# Patient Record
Sex: Male | Born: 1970 | Race: White | Hispanic: No | Smoking: Current every day smoker
Health system: Southern US, Community
[De-identification: ages and names within clinical notes are randomized; demographics above are authoritative.]

## PROBLEM LIST (undated history)

## (undated) DIAGNOSIS — F4310 Post-traumatic stress disorder, unspecified: Secondary | ICD-10-CM

## (undated) DIAGNOSIS — F329 Major depressive disorder, single episode, unspecified: Secondary | ICD-10-CM

## (undated) DIAGNOSIS — E781 Pure hyperglyceridemia: Secondary | ICD-10-CM

## (undated) DIAGNOSIS — F419 Anxiety disorder, unspecified: Secondary | ICD-10-CM

## (undated) DIAGNOSIS — F32A Depression, unspecified: Secondary | ICD-10-CM

## (undated) DIAGNOSIS — M1990 Unspecified osteoarthritis, unspecified site: Secondary | ICD-10-CM

## (undated) DIAGNOSIS — C801 Malignant (primary) neoplasm, unspecified: Secondary | ICD-10-CM

## (undated) DIAGNOSIS — M549 Dorsalgia, unspecified: Secondary | ICD-10-CM

## (undated) DIAGNOSIS — D696 Thrombocytopenia, unspecified: Secondary | ICD-10-CM

## (undated) HISTORY — PX: WISDOM TOOTH EXTRACTION: SHX21

## (undated) HISTORY — PX: TOOTH EXTRACTION: SUR596

## (undated) HISTORY — DX: Depression, unspecified: F32.A

## (undated) HISTORY — DX: Unspecified osteoarthritis, unspecified site: M19.90

---
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
## 2002-01-30 ENCOUNTER — Emergency Department (HOSPITAL_COMMUNITY): Admission: EM | Admit: 2002-01-30 | Discharge: 2002-01-30 | Payer: Self-pay | Admitting: Emergency Medicine

## 2002-06-27 ENCOUNTER — Emergency Department (HOSPITAL_COMMUNITY): Admission: EM | Admit: 2002-06-27 | Discharge: 2002-06-27 | Payer: Self-pay | Admitting: Emergency Medicine

## 2002-11-08 ENCOUNTER — Emergency Department (HOSPITAL_COMMUNITY): Admission: EM | Admit: 2002-11-08 | Discharge: 2002-11-08 | Payer: Self-pay | Admitting: Emergency Medicine

## 2003-02-01 ENCOUNTER — Emergency Department (HOSPITAL_COMMUNITY): Admission: EM | Admit: 2003-02-01 | Discharge: 2003-02-01 | Payer: Self-pay

## 2003-07-01 ENCOUNTER — Emergency Department (HOSPITAL_COMMUNITY): Admission: AD | Admit: 2003-07-01 | Discharge: 2003-07-02 | Payer: Self-pay | Admitting: Emergency Medicine

## 2003-07-02 ENCOUNTER — Emergency Department (HOSPITAL_COMMUNITY): Admission: EM | Admit: 2003-07-02 | Discharge: 2003-07-02 | Payer: Self-pay

## 2004-01-07 ENCOUNTER — Emergency Department (HOSPITAL_COMMUNITY): Admission: EM | Admit: 2004-01-07 | Discharge: 2004-01-07 | Payer: Self-pay | Admitting: Emergency Medicine

## 2004-03-11 ENCOUNTER — Emergency Department (HOSPITAL_COMMUNITY): Admission: EM | Admit: 2004-03-11 | Discharge: 2004-03-12 | Payer: Self-pay | Admitting: Emergency Medicine

## 2004-03-29 ENCOUNTER — Emergency Department (HOSPITAL_COMMUNITY): Admission: EM | Admit: 2004-03-29 | Discharge: 2004-03-29 | Payer: Self-pay | Admitting: Emergency Medicine

## 2004-04-18 ENCOUNTER — Emergency Department (HOSPITAL_COMMUNITY): Admission: EM | Admit: 2004-04-18 | Discharge: 2004-04-18 | Payer: Self-pay | Admitting: Family Medicine

## 2004-09-01 ENCOUNTER — Emergency Department (HOSPITAL_COMMUNITY): Admission: EM | Admit: 2004-09-01 | Discharge: 2004-09-01 | Payer: Self-pay | Admitting: Emergency Medicine

## 2004-12-07 ENCOUNTER — Emergency Department (HOSPITAL_COMMUNITY): Admission: EM | Admit: 2004-12-07 | Discharge: 2004-12-07 | Payer: Self-pay | Admitting: Emergency Medicine

## 2005-07-05 ENCOUNTER — Emergency Department (HOSPITAL_COMMUNITY): Admission: EM | Admit: 2005-07-05 | Discharge: 2005-07-05 | Payer: Self-pay | Admitting: *Deleted

## 2005-08-29 ENCOUNTER — Emergency Department (HOSPITAL_COMMUNITY): Admission: EM | Admit: 2005-08-29 | Discharge: 2005-08-29 | Payer: Self-pay | Admitting: Emergency Medicine

## 2005-09-12 ENCOUNTER — Emergency Department (HOSPITAL_COMMUNITY): Admission: EM | Admit: 2005-09-12 | Discharge: 2005-09-12 | Payer: Self-pay | Admitting: Family Medicine

## 2005-10-02 ENCOUNTER — Emergency Department (HOSPITAL_COMMUNITY): Admission: EM | Admit: 2005-10-02 | Discharge: 2005-10-02 | Payer: Self-pay | Admitting: Emergency Medicine

## 2005-10-31 ENCOUNTER — Emergency Department (HOSPITAL_COMMUNITY): Admission: EM | Admit: 2005-10-31 | Discharge: 2005-10-31 | Payer: Self-pay | Admitting: *Deleted

## 2006-03-10 ENCOUNTER — Emergency Department (HOSPITAL_COMMUNITY): Admission: EM | Admit: 2006-03-10 | Discharge: 2006-03-10 | Payer: Self-pay | Admitting: Emergency Medicine

## 2006-03-10 ENCOUNTER — Emergency Department (HOSPITAL_COMMUNITY): Admission: EM | Admit: 2006-03-10 | Discharge: 2006-03-11 | Payer: Self-pay | Admitting: Emergency Medicine

## 2006-07-27 ENCOUNTER — Emergency Department (HOSPITAL_COMMUNITY): Admission: EM | Admit: 2006-07-27 | Discharge: 2006-07-27 | Payer: Self-pay | Admitting: Emergency Medicine

## 2006-09-18 ENCOUNTER — Emergency Department (HOSPITAL_COMMUNITY): Admission: EM | Admit: 2006-09-18 | Discharge: 2006-09-18 | Payer: Self-pay | Admitting: Emergency Medicine

## 2006-10-20 ENCOUNTER — Emergency Department (HOSPITAL_COMMUNITY): Admission: EM | Admit: 2006-10-20 | Discharge: 2006-10-20 | Payer: Self-pay | Admitting: Emergency Medicine

## 2007-03-16 ENCOUNTER — Emergency Department (HOSPITAL_COMMUNITY): Admission: EM | Admit: 2007-03-16 | Discharge: 2007-03-16 | Payer: Self-pay | Admitting: Emergency Medicine

## 2007-11-03 IMAGING — CR DG KNEE COMPLETE 4+V*L*
4 series · 4 of 4 positions shown · non-contrast
Comparison: none

CLINICAL DATA: Recent fall with left knee pain.  
 LEFT KNEE - 4 VIEW:

[t knee ap left]
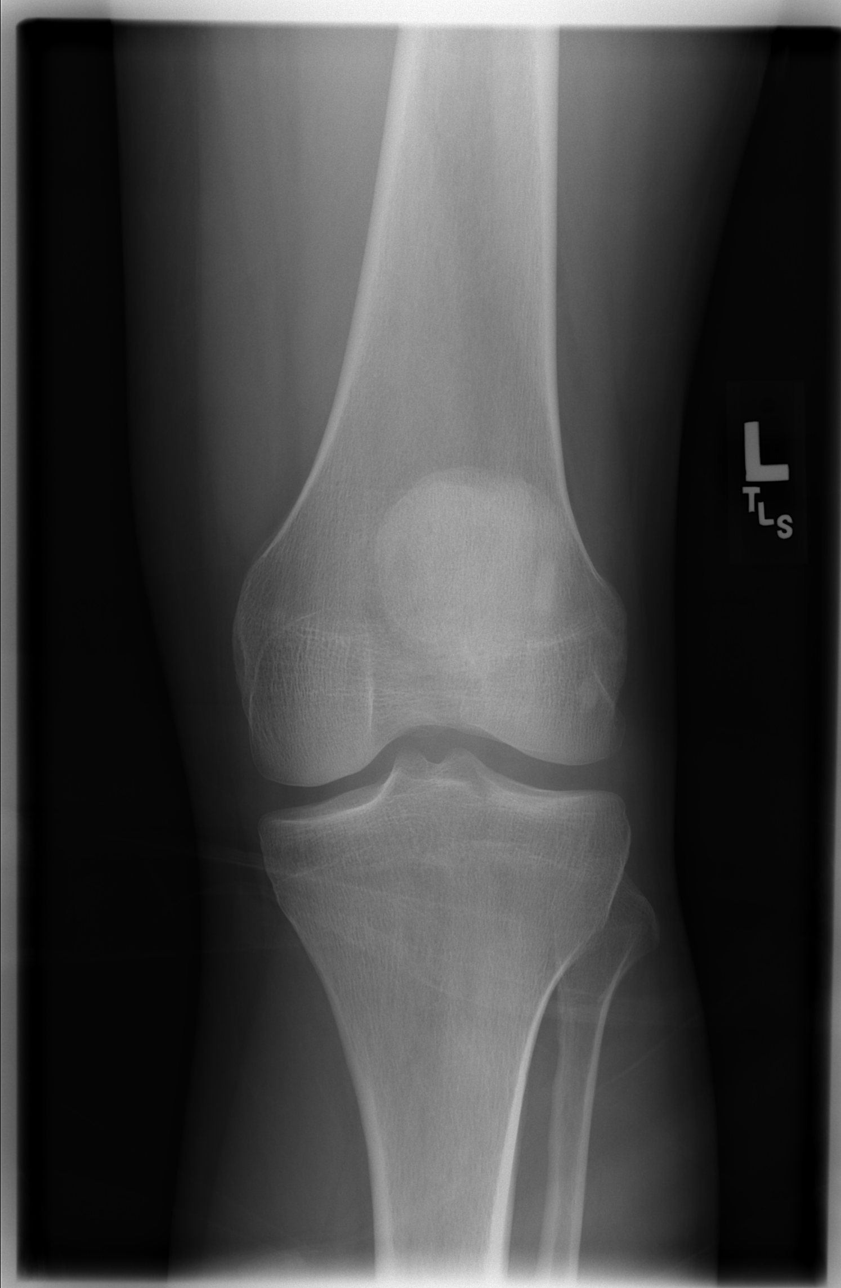

[t knee oblique left (1 of 2)]
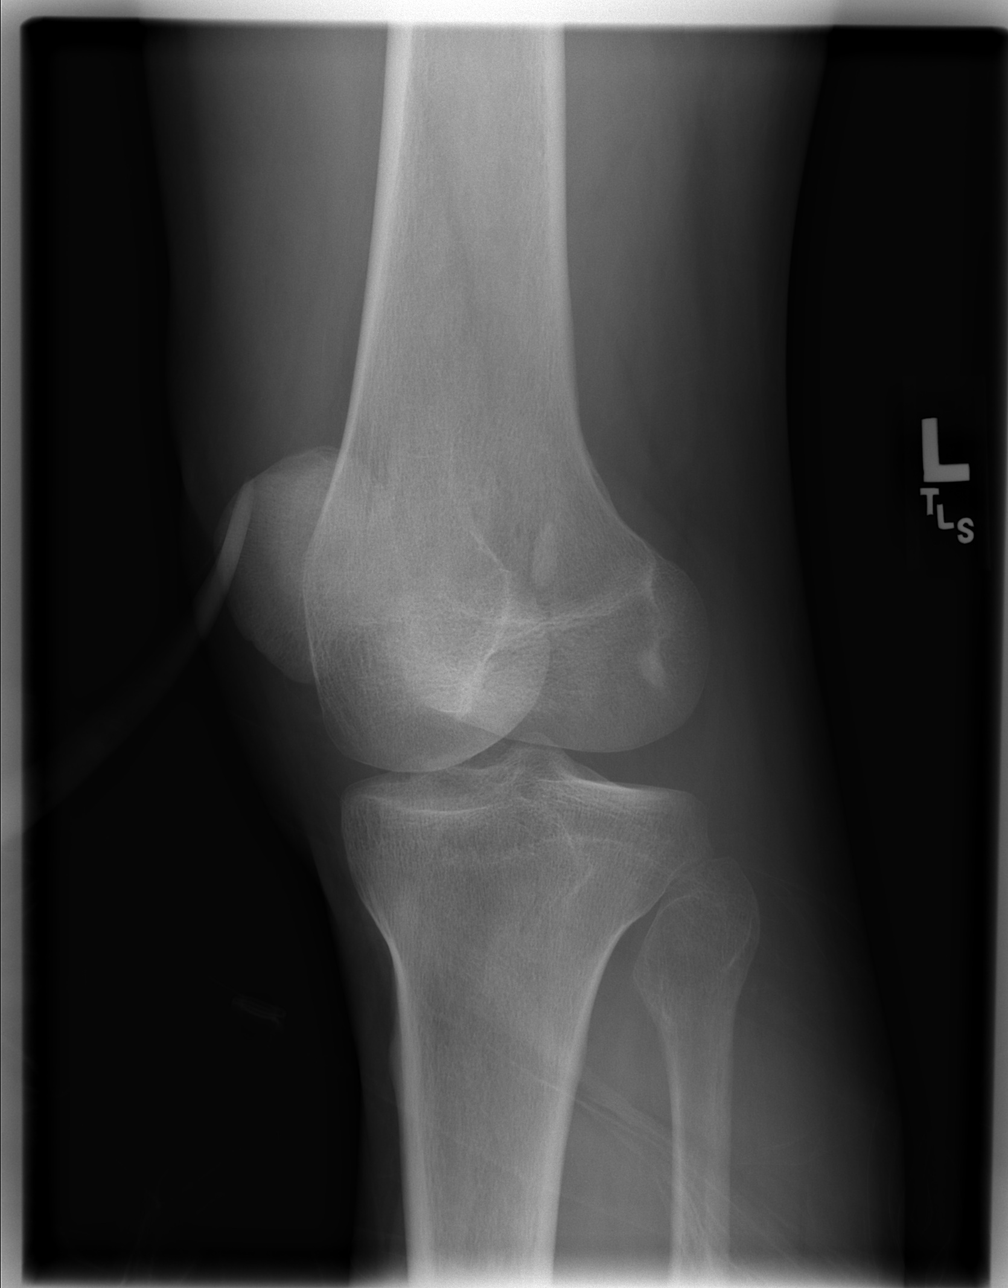

[t knee oblique left (2 of 2)]
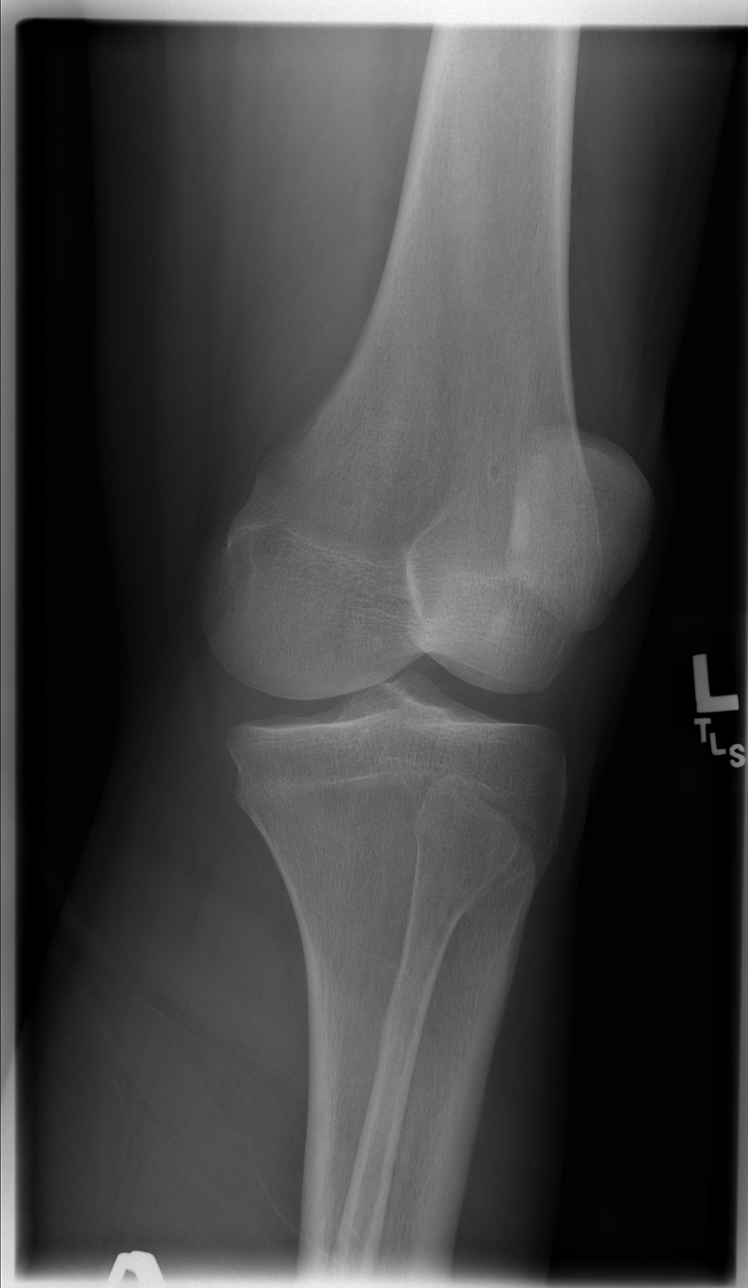

[t knee lat left]
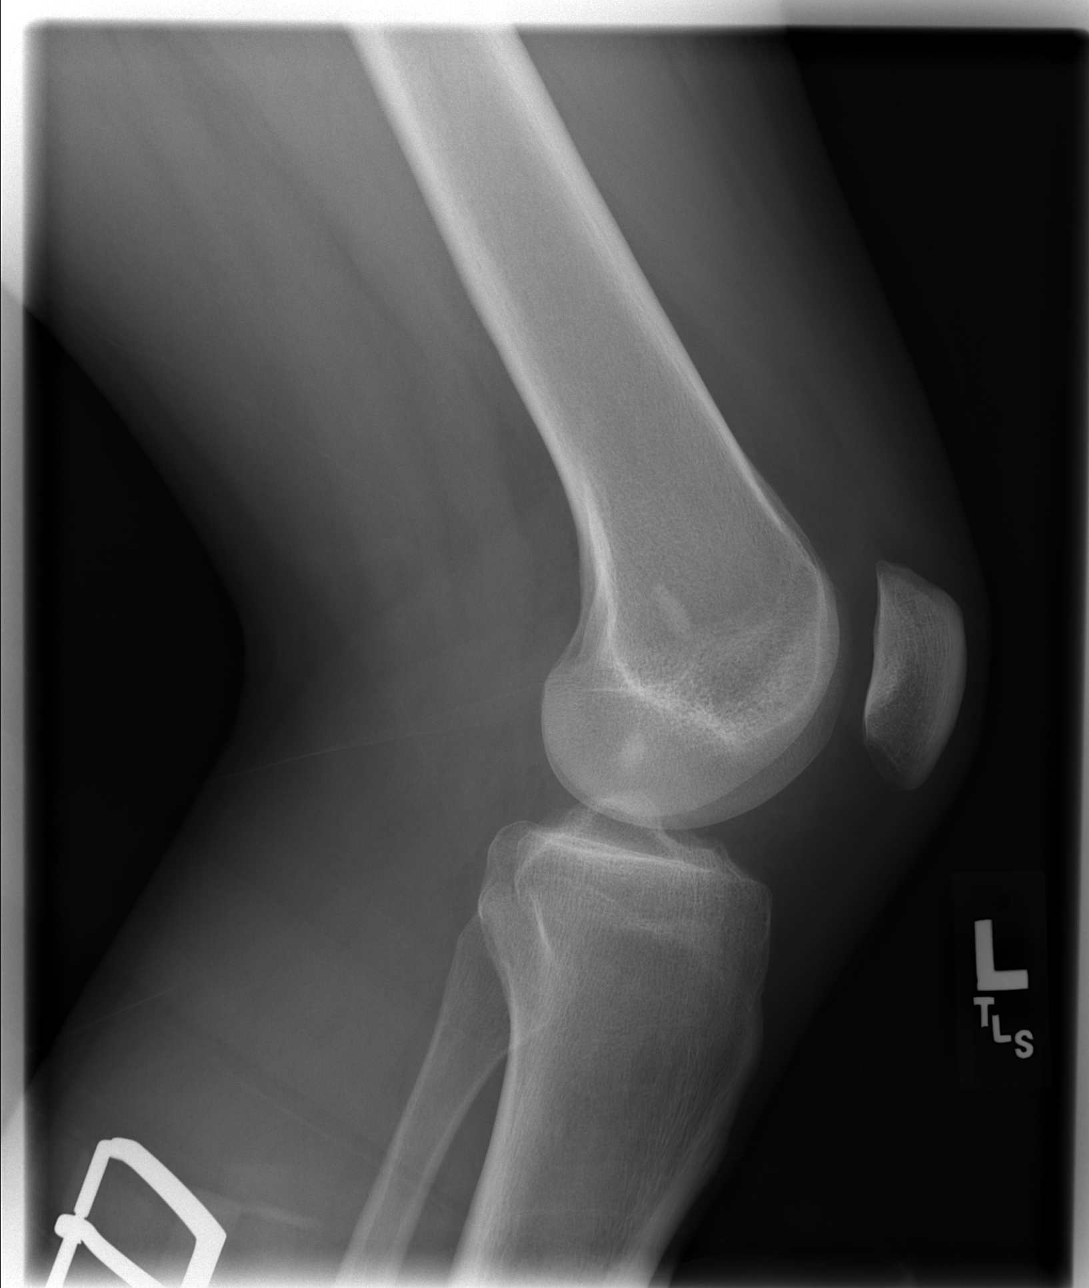

[4 of 4 positions shown; findings below may reference images not displayed]

FINDINGS: No evidence of acute abnormality including fracture, subluxation, dislocation, or knee effusion.
IMPRESSION: No acute abnormalities.

## 2009-07-27 ENCOUNTER — Emergency Department (HOSPITAL_COMMUNITY): Admission: EM | Admit: 2009-07-27 | Discharge: 2009-07-27 | Payer: Self-pay | Admitting: Emergency Medicine

## 2009-10-06 ENCOUNTER — Ambulatory Visit: Payer: Self-pay | Admitting: Orthopedic Surgery

## 2009-10-06 DIAGNOSIS — G5690 Unspecified mononeuropathy of unspecified upper limb: Secondary | ICD-10-CM | POA: Insufficient documentation

## 2009-10-06 DIAGNOSIS — M25519 Pain in unspecified shoulder: Secondary | ICD-10-CM

## 2009-10-07 ENCOUNTER — Encounter: Payer: Self-pay | Admitting: Orthopedic Surgery

## 2009-10-08 ENCOUNTER — Encounter: Payer: Self-pay | Admitting: Orthopedic Surgery

## 2009-10-28 ENCOUNTER — Encounter: Payer: Self-pay | Admitting: Orthopedic Surgery

## 2010-04-06 ENCOUNTER — Encounter: Payer: Self-pay | Admitting: Orthopedic Surgery
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
## 2010-06-08 NOTE — Letter (Signed)
Summary: History form  History form   Imported By: Jacklynn Ganong 10/15/2009 15:54:28  _____________________________________________________________________  External Attachment:    Type:   Image     Comment:   External Document

## 2010-06-08 NOTE — Letter (Signed)
Summary: Work [facility]  [address]  [place], [place] [address]   Phone: [phone]  Fax: [phone]    Today's Date: Oct 06, 2009  Name of Patient: Nicholas Marquez  The above named patient had a medical visit today at:  3:30 pm  Please take this into consideration when reviewing the time away from work/school.    Special Instructions:  [ X]  Patient to return to work Wednesday 10/07/09  [  ] To be off the remainder of today, returning to the normal work / school schedule tomorrow.  [  ] To be off until the next scheduled appointment on ______________________.  [  ] Other ________________________________________________________________ ________________________________________________________________________   Sincerely yours,   Dr. Terrance Mass.

## 2010-06-08 NOTE — Letter (Signed)
Summary: *Orthopedic Consult Note  [facility]  [address]  [place], [place] [address]   Phone: [phone]  Fax: [phone]    Re:    JOURDYN FERRIN DOB:    12-Dec-1970   To [facility]:    Thank you for requesting that we see the above patient for consultation.  A copy of the detailed office note will be sent under separate cover, for your review.  Evaluation today is consistent with:  1)  UNSPECIFIED MONONEURITIS OF UPPER LIMB (ICD-354.9) 2)  SHOULDER PAIN (ICD-719.41)   Our recommendation is for: inject area of pain        Thank you for this opportunity to look after your patient.  Sincerely,   Terrance Mass. MD.

## 2010-06-08 NOTE — Letter (Signed)
Summary: Consult report + Office note Radiation protection practitioner  Consult report + Office note Fax [facility]   Imported By: Cammie Sickle 10/28/2009 20:38:53  _____________________________________________________________________  External Attachment:    Type:   Image     Comment:   External Document

## 2010-06-10 NOTE — Letter (Signed)
Summary: medical record request  medical record request   Imported By: Jacklynn Ganong 04/21/2010 11:45:21  _____________________________________________________________________  External Attachment:    Type:   Image     Comment:   External Document

## 2010-08-02 LAB — COMPREHENSIVE METABOLIC PANEL
ALT: 20 U/L (ref 0–53)
AST: 24 U/L (ref 0–37)
Albumin: 4 g/dL (ref 3.5–5.2)
BUN: 8 mg/dL (ref 6–23)
CO2: 24 mEq/L (ref 19–32)
Potassium: 3.9 mEq/L (ref 3.5–5.1)
Total Bilirubin: 0.5 mg/dL (ref 0.3–1.2)
Total Protein: 7.2 g/dL (ref 6.0–8.3)

## 2010-08-02 LAB — DIFFERENTIAL
Basophils Absolute: 0 10*3/uL (ref 0.0–0.1)
Eosinophils Relative: 1 % (ref 0–5)
Lymphs Abs: 3.1 10*3/uL (ref 0.7–4.0)
Monocytes Absolute: 0.6 10*3/uL (ref 0.1–1.0)
Monocytes Relative: 5 % (ref 3–12)

## 2010-08-02 LAB — CBC
HCT: 46 % (ref 39.0–52.0)
MCHC: 34.4 g/dL (ref 30.0–36.0)

## 2011-07-28 ENCOUNTER — Encounter (HOSPITAL_COMMUNITY): Payer: Self-pay | Admitting: *Deleted

## 2011-07-28 ENCOUNTER — Emergency Department (HOSPITAL_COMMUNITY)
Admission: EM | Admit: 2011-07-28 | Discharge: 2011-07-28 | Disposition: A | Discharge status: Home / Self Care | Payer: Non-veteran care | Attending: Emergency Medicine | Admitting: Emergency Medicine

## 2011-07-28 ENCOUNTER — Emergency Department (HOSPITAL_COMMUNITY): Payer: Non-veteran care

## 2011-07-28 DIAGNOSIS — R51 Headache: Secondary | ICD-10-CM | POA: Insufficient documentation

## 2011-07-28 DIAGNOSIS — R42 Dizziness and giddiness: Secondary | ICD-10-CM | POA: Insufficient documentation

## 2011-07-28 DIAGNOSIS — F17200 Nicotine dependence, unspecified, uncomplicated: Secondary | ICD-10-CM | POA: Insufficient documentation

## 2011-07-28 DIAGNOSIS — R05 Cough: Secondary | ICD-10-CM

## 2011-07-28 DIAGNOSIS — R070 Pain in throat: Secondary | ICD-10-CM | POA: Insufficient documentation

## 2011-07-28 DIAGNOSIS — R059 Cough, unspecified: Secondary | ICD-10-CM | POA: Insufficient documentation

## 2011-07-28 DIAGNOSIS — R509 Fever, unspecified: Secondary | ICD-10-CM | POA: Insufficient documentation

## 2011-07-28 DIAGNOSIS — J3489 Other specified disorders of nose and nasal sinuses: Secondary | ICD-10-CM | POA: Insufficient documentation

## 2011-07-28 DIAGNOSIS — H9209 Otalgia, unspecified ear: Secondary | ICD-10-CM | POA: Insufficient documentation

## 2011-07-28 HISTORY — DX: Dorsalgia, unspecified: M54.9

## 2011-07-28 MED ORDER — BENZONATATE 100 MG PO CAPS: 100.0000 mg | ORAL_CAPSULE | Freq: Three times a day (TID) | ORAL | Status: AC

## 2011-07-28 MED ORDER — ACETAMINOPHEN 325 MG PO TABS
650.0000 mg | ORAL_TABLET | Freq: Once | ORAL | Status: AC
  Administered 2011-07-28: 650 mg via ORAL
  Filled 2011-07-28: qty 2
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
## 2011-07-28 NOTE — ED Notes (Signed)
Pt c/o cough, congestion, dizziness, fever up to 101.5, headache and pain in bilateral ribs with cough. Pt states that his sputum is white.

## 2011-07-28 NOTE — Discharge Instructions (Signed)
Finish your prescription of levaquin. Take the cough medication as directed--use tylenol/motrin to control your fever, return here if you feel worse  Cough, Adult  A cough is a reflex that helps clear your throat and airways. It can help heal the body or may be a reaction to an irritated airway. A cough may only last 2 or 3 weeks (acute) or may last more than 8 weeks (chronic).  CAUSES Acute cough:  Viral or bacterial infections.  Chronic cough:  Infections.   Allergies.   Asthma.   Post-nasal drip.   Smoking.   Heartburn or acid reflux.   Some medicines.   Chronic lung problems (COPD).   Cancer.  SYMPTOMS   Cough.   Fever.   Chest pain.   Increased breathing rate.   High-pitched whistling sound when breathing (wheezing).   Colored mucus that you cough up (sputum).  TREATMENT   A bacterial cough may be treated with antibiotic medicine.   A viral cough must run its course and will not respond to antibiotics.   Your caregiver may recommend other treatments if you have a chronic cough.  HOME CARE INSTRUCTIONS   Only take over-the-counter or prescription medicines for pain, discomfort, or fever as directed by your caregiver. Use cough suppressants only as directed by your caregiver.   Use a cold steam vaporizer or humidifier in your bedroom or home to help loosen secretions.   Sleep in a semi-upright position if your cough is worse at night.   Rest as needed.   Stop smoking if you smoke.  SEEK IMMEDIATE MEDICAL CARE IF:   You have pus in your sputum.   Your cough starts to worsen.   You cannot control your cough with suppressants and are losing sleep.   You begin coughing up blood.   You have difficulty breathing.   You develop pain which is getting worse or is uncontrolled with medicine.   You have a fever.  MAKE SURE YOU:   Understand these instructions.   Will watch your condition.   Will get help right away if you are not doing well or  get worse.  Document Released: 10/22/2010 Document Revised: 04/14/2011 Document Reviewed: 10/22/2010 [facility] Patient Information 2012 [place], [place].

## 2013-07-21 ENCOUNTER — Emergency Department (HOSPITAL_COMMUNITY): Payer: PRIVATE HEALTH INSURANCE

## 2013-07-21 ENCOUNTER — Encounter (HOSPITAL_COMMUNITY): Payer: Self-pay | Admitting: Emergency Medicine

## 2013-07-21 ENCOUNTER — Emergency Department (HOSPITAL_COMMUNITY)
Admission: EM | Admit: 2013-07-21 | Discharge: 2013-07-21 | Disposition: A | Discharge status: Home / Self Care | Payer: PRIVATE HEALTH INSURANCE | Attending: Emergency Medicine | Admitting: Emergency Medicine

## 2013-07-21 DIAGNOSIS — Z9889 Other specified postprocedural states: Secondary | ICD-10-CM | POA: Insufficient documentation

## 2013-07-21 DIAGNOSIS — Y929 Unspecified place or not applicable: Secondary | ICD-10-CM | POA: Insufficient documentation

## 2013-07-21 DIAGNOSIS — IMO0002 Reserved for concepts with insufficient information to code with codable children: Secondary | ICD-10-CM | POA: Insufficient documentation

## 2013-07-21 DIAGNOSIS — Z8639 Personal history of other endocrine, nutritional and metabolic disease: Secondary | ICD-10-CM | POA: Insufficient documentation

## 2013-07-21 DIAGNOSIS — Z79899 Other long term (current) drug therapy: Secondary | ICD-10-CM | POA: Insufficient documentation

## 2013-07-21 DIAGNOSIS — F411 Generalized anxiety disorder: Secondary | ICD-10-CM | POA: Insufficient documentation

## 2013-07-21 DIAGNOSIS — F17200 Nicotine dependence, unspecified, uncomplicated: Secondary | ICD-10-CM | POA: Insufficient documentation

## 2013-07-21 DIAGNOSIS — S90129A Contusion of unspecified lesser toe(s) without damage to nail, initial encounter: Secondary | ICD-10-CM

## 2013-07-21 DIAGNOSIS — Y9389 Activity, other specified: Secondary | ICD-10-CM | POA: Insufficient documentation

## 2013-07-21 DIAGNOSIS — R0789 Other chest pain: Secondary | ICD-10-CM

## 2013-07-21 DIAGNOSIS — Z862 Personal history of diseases of the blood and blood-forming organs and certain disorders involving the immune mechanism: Secondary | ICD-10-CM | POA: Insufficient documentation

## 2013-07-21 HISTORY — DX: Post-traumatic stress disorder, unspecified: F43.10

## 2013-07-21 HISTORY — DX: Pure hyperglyceridemia: E78.1

## 2013-07-21 HISTORY — DX: Thrombocytopenia, unspecified: D69.6

## 2013-07-21 HISTORY — DX: Anxiety disorder, unspecified: F41.9

## 2013-07-21 LAB — COMPREHENSIVE METABOLIC PANEL
ALBUMIN: 3.6 g/dL (ref 3.5–5.2)
ALK PHOS: 82 U/L (ref 39–117)
ALT: 20 U/L (ref 0–53)
AST: 27 U/L (ref 0–37)
BUN: 11 mg/dL (ref 6–23)
CO2: 27 meq/L (ref 19–32)
CREATININE: 0.91 mg/dL (ref 0.50–1.35)
Calcium: 8.8 mg/dL (ref 8.4–10.5)
Chloride: 101 mEq/L (ref 96–112)
GFR calc Af Amer: >90 mL/min (ref >90)
GFR calc non Af Amer: >90 mL/min (ref >90)
Glucose, Bld: 139 mg/dL — ABNORMAL HIGH (ref 70–99)
POTASSIUM: 3.8 meq/L (ref 3.7–5.3)
Sodium: 139 mEq/L (ref 137–147)
Total Bilirubin: 0.3 mg/dL (ref 0.3–1.2)
Total Protein: 8.6 g/dL — ABNORMAL HIGH (ref 6.0–8.3)

## 2013-07-21 LAB — CBC
HEMATOCRIT: 44.1 % (ref 39.0–52.0)
Hemoglobin: 15.1 g/dL (ref 13.0–17.0)
MCH: 30.4 pg (ref 26.0–34.0)
MCHC: 34.2 g/dL (ref 30.0–36.0)
MCV: 88.7 fL (ref 78.0–100.0)
PLATELETS: 219 10*3/uL (ref 150–400)
RBC: 4.97 MIL/uL (ref 4.22–5.81)
RDW: 13.2 % (ref 11.5–15.5)
WBC: 10.2 10*3/uL (ref 4.0–10.5)

## 2013-07-21 LAB — TROPONIN I

## 2013-07-21 NOTE — Discharge Instructions (Signed)
Chest Pain (Nonspecific) Your toe is not broken and your platelets are improved. Your chest pain does not appear to be related to a heart problem. Followup with your doctor. Return to ED if you develop new or worsening symptoms. It is often hard to give a specific diagnosis for the cause of chest pain. There is always a chance that your pain could be related to something serious, such as a heart attack or a blood clot in the lungs. You need to follow up with your caregiver for further evaluation. CAUSES   Heartburn.  Pneumonia or bronchitis.  Anxiety or stress.  Inflammation around your heart (pericarditis) or lung (pleuritis or pleurisy).  A blood clot in the lung.  A collapsed lung (pneumothorax). It can develop suddenly on its own (spontaneous pneumothorax) or from injury (trauma) to the chest.  Shingles infection (herpes zoster virus). The chest wall is composed of bones, muscles, and cartilage. Any of these can be the source of the pain.  The bones can be bruised by injury.  The muscles or cartilage can be strained by coughing or overwork.  The cartilage can be affected by inflammation and become sore (costochondritis). DIAGNOSIS  Lab tests or other studies, such as X-rays, electrocardiography, stress testing, or cardiac imaging, may be needed to find the cause of your pain.  TREATMENT   Treatment depends on what may be causing your chest pain. Treatment may include:  Acid blockers for heartburn.  Anti-inflammatory medicine.  Pain medicine for inflammatory conditions.  Antibiotics if an infection is present.  You may be advised to change lifestyle habits. This includes stopping smoking and avoiding alcohol, caffeine, and chocolate.  You may be advised to keep your head raised (elevated) when sleeping. This reduces the chance of acid going backward from your stomach into your esophagus.  Most of the time, nonspecific chest pain will improve within 2 to 3 days with rest  and mild pain medicine. HOME CARE INSTRUCTIONS   If antibiotics were prescribed, take your antibiotics as directed. Finish them even if you start to feel better.  For the next few days, avoid physical activities that bring on chest pain. Continue physical activities as directed.  Do not smoke.  Avoid drinking alcohol.  Only take over-the-counter or prescription medicine for pain, discomfort, or fever as directed by your caregiver.  Follow your caregiver's suggestions for further testing if your chest pain does not go away.  Keep any follow-up appointments you made. If you do not go to an appointment, you could develop lasting (chronic) problems with pain. If there is any problem keeping an appointment, you must call to reschedule. SEEK MEDICAL CARE IF:   You think you are having problems from the medicine you are taking. Read your medicine instructions carefully.  Your chest pain does not go away, even after treatment.  You develop a rash with blisters on your chest. SEEK IMMEDIATE MEDICAL CARE IF:   You have increased chest pain or pain that spreads to your arm, neck, jaw, back, or abdomen.  You develop shortness of breath, an increasing cough, or you are coughing up blood.  You have severe back or abdominal pain, feel nauseous, or vomit.  You develop severe weakness, fainting, or chills.  You have a fever. THIS IS AN EMERGENCY. Do not wait to see if the pain will go away. Get medical help at once. Call your local emergency services (911 in [place] Do not drive yourself to the hospital. MAKE SURE YOU:  Understand these instructions.  Will watch your condition.  Will get help right away if you are not doing well or get worse. Document Released: 02/02/2005 Document Revised: 07/18/2011 Document Reviewed: 11/29/2007 [facility] Patient Information 2014 [place].

## 2013-07-21 NOTE — ED Provider Notes (Signed)
CSN: [id number]     Arrival date & time 07/21/13  1907 History   First MD Initiated Contact with Patient 07/21/13 2004     Chief Complaint  Patient presents with  . Chest Pain     (Consider location/radiation/quality/duration/timing/severity/associated sxs/prior Treatment) HPI Comments: Patient complains of left fourth toe pain and discoloration after hitting it on a box of cat litter this evening. No open wounds. On his way to the hospital, he developed some tightness in his chest that he has had before with anxiety and he thinks this is similar. His been ongoing now for the past 90 minutes. It is improving. Denies any shortness of breath, nausea, vomiting, syncope. 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 He received IVIG infusion earlier this week. Denies any cardiac history. He has never had a stress test.  The history is provided by the patient.    Past Medical History  Diagnosis Date  . Back pain   . PTSD (post-traumatic stress disorder)   . Anxiety   . Thrombocytopenia   . High triglycerides    Past Surgical History  Procedure Laterality Date  . Wisdom tooth extraction     History reviewed. No pertinent family history. History  Substance Use Topics  . Smoking status: Current Every Day Smoker -- 1.50 packs/day    Types: Cigarettes  . Smokeless tobacco: Not on file  . Alcohol Use: No    Review of Systems  Constitutional: Negative for activity change and appetite change.  HENT: Negative for congestion and rhinorrhea.   Respiratory: Positive for chest tightness. Negative for cough and shortness of breath.   Cardiovascular: Positive for chest pain.  Gastrointestinal: Negative for nausea, vomiting and abdominal pain.  Genitourinary: Negative for dysuria and hematuria.  Musculoskeletal: Positive for arthralgias and myalgias.  Skin: Negative for rash.  Neurological: Negative for dizziness, tremors and headaches.   A complete 10 system review of systems was obtained and all systems are negative except as noted in the HPI and PMH.      Allergies  Review of patient's allergies indicates no known allergies.  Home Medications   Current Outpatient Rx  Name  Route  Sig  Dispense  Refill  . ALPRAZolam (XANAX) 0.25 MG tablet   Oral   Take 0.25 mg by mouth 2 (two) times daily as needed for anxiety.         . cloNIDine (CATAPRES) 0.1 MG tablet   Oral   Take 0.1 mg by mouth at bedtime.         Marland Kitchen escitalopram (LEXAPRO) 20 MG tablet   Oral   Take 20 mg by mouth daily.         Marland Kitchen HYDROcodone-acetaminophen (NORCO/VICODIN) 5-325 MG per tablet   Oral   Take 1 tablet by mouth 4 (four) times daily.         Javier Docker Oil 300 MG CAPS   Oral   Take 300 mg by mouth daily.         . Multiple Vitamins-Minerals (ONE DAILY MENS PO)   Oral   Take 1 tablet by mouth daily.         . traZODone (DESYREL) 50 MG tablet   Oral   Take 75 mg by mouth at bedtime.         . Vitamin D, Ergocalciferol, (DRISDOL) 50000 UNITS CAPS capsule   Oral   Take 50,000 Units by mouth once a week. On  monday          BP 112/71  Pulse 70  Temp(Src) 99.2 F (37.3 C) (Oral)  Resp 16  Ht 5\' 10"  (1.778 m)  Wt 230 lb (104.327 kg)  BMI 33.00 kg/m2  SpO2 99% Physical Exam  Constitutional: He is oriented to person, place, and time. He appears well-developed and well-nourished. No distress.  HENT:  Head: Normocephalic and atraumatic.  Mouth/Throat: Oropharynx is clear and moist. No oropharyngeal exudate.  Eyes: Conjunctivae and EOM are normal. Pupils are equal, round, and reactive to light.  Neck: Normal range of motion. Neck supple.  Cardiovascular: Normal rate, regular rhythm and normal heart sounds.   Pulmonary/Chest: Effort normal. No respiratory distress.  Abdominal: Soft. There is no tenderness. There is no rebound and no guarding.  Musculoskeletal: Normal range of motion. He exhibits no edema and no  tenderness.  Ecchymosis and swelling to left fourth toe. No open wounds. +2 DP and PT pulses.  Neurological: He is alert and oriented to person, place, and time. No cranial nerve deficit. He exhibits normal muscle tone. Coordination normal.  Skin: Skin is warm.    ED Course  Procedures (including critical care time) Labs Review Labs Reviewed  COMPREHENSIVE METABOLIC PANEL - Abnormal; Notable for the following:    Glucose, Bld 139 (*)    Total Protein 8.6 (*)    All other components within normal limits  CBC  TROPONIN I  TROPONIN I   Imaging Review Dg Chest Portable 1 View  07/21/2013   CLINICAL DATA:  Chest pain.  EXAM: PORTABLE CHEST - 1 VIEW  COMPARISON:  07/28/2011  FINDINGS: Heart size and pulmonary vascularity are normal. There is chronic accentuation of the interstitial markings. No acute infiltrates or effusions. No acute osseous abnormality. Chronic deformity of the lateral aspect of the right sixth rib.  IMPRESSION: No acute abnormality.  Chronic interstitial lung disease.   Electronically Signed   By: Rozetta Nunnery M.D.   On: 07/21/2013 20:39   Dg Toe 4th Left  07/21/2013   CLINICAL DATA:  Fourth toe swelling.  EXAM: LEFT FOURTH TOE  COMPARISON:  None.  FINDINGS: There is no evidence of fracture or dislocation. There is no evidence of arthropathy or other focal bone abnormality. Soft tissues are unremarkable.  IMPRESSION: Negative.   Electronically Signed   By: Marcello Moores  Register   On: 07/21/2013 20:44     EKG Interpretation   Date/Time:  Sunday July 21 2013 20:35:46 EDT Ventricular Rate:  76 PR Interval:  126 QRS Duration: 96 QT Interval:  386 QTC Calculation: 434 R Axis:   64 Text Interpretation:  Normal sinus rhythm Normal ECG When compared with  ECG of 27-Jul-2009 20:03, No significant change was found No significant  change was found Confirmed by Wyvonnia Dusky  MD, Kristiana Jacko [phone]) on 07/21/2013  8:47:54 PM      MDM   Final diagnoses:  Toe contusion  Atypical chest  pain   Toe pain after trauma. Recent diagnosis of thrombocytopenia. Chest tightness since arrival similar to previous episodes of anxiety.  EKG nsr without ST changes.  Patient appears anxious.  Toe ecchymotic but no bleeding.  Platelets 219.  Patient states he last count was in 72s. CXR negative. Toe xray negative.  Chest pain appears atypical and likely from anxiety.  EKG nsr.  Troponin negative x 2. Pain has resolved in the ED. Treat toe contusion with buddy tape and RICE. [facility]. Return precautions discussed.  Ezequiel Essex, MD 07/21/13 [phone]

## 2013-07-21 NOTE — ED Notes (Signed)
Pt c/o chest pain due to anxiety of his toe turning purple.

## 2013-07-21 NOTE — ED Notes (Signed)
nad noted prior to dc. Dc instructions reviewed and expained.

## 2013-07-21 NOTE — ED Notes (Signed)
Patient c/o chest tightness on the way to ED to get left 4th toe looked at.  Patient states he has anxiety and was told he has low platelets last week and to come to ED for any injuries that cause bruising or bleeding.  Patient states he hit his left 4th toe on a box of kitty litter.  Bruising noted to left 4th toe.

## 2019-04-16 ENCOUNTER — Ambulatory Visit (HOSPITAL_COMMUNITY): Payer: Non-veteran care | Admitting: Hematology

## 2019-04-17 ENCOUNTER — Ambulatory Visit (HOSPITAL_COMMUNITY): Payer: Non-veteran care | Admitting: Nurse Practitioner

## 2019-04-17 ENCOUNTER — Ambulatory Visit (HOSPITAL_COMMUNITY): Payer: Non-veteran care | Admitting: Hematology

## 2019-04-19 ENCOUNTER — Encounter (HOSPITAL_COMMUNITY): Payer: Self-pay

## 2019-04-19 DIAGNOSIS — F4310 Post-traumatic stress disorder, unspecified: Secondary | ICD-10-CM

## 2019-04-19 HISTORY — DX: Post-traumatic stress disorder, unspecified: F43.10

## 2019-04-22 ENCOUNTER — Ambulatory Visit (HOSPITAL_COMMUNITY): Payer: Non-veteran care | Admitting: Hematology

## 2019-05-30 ENCOUNTER — Other Ambulatory Visit: Payer: Self-pay

## 2019-05-30 ENCOUNTER — Encounter (HOSPITAL_COMMUNITY): Payer: Self-pay | Admitting: Hematology

## 2019-05-30 ENCOUNTER — Inpatient Hospital Stay (HOSPITAL_COMMUNITY): Payer: No Typology Code available for payment source | Attending: Hematology | Admitting: Hematology

## 2019-05-30 ENCOUNTER — Inpatient Hospital Stay (HOSPITAL_COMMUNITY): Payer: No Typology Code available for payment source

## 2019-05-30 VITALS — BP 108/77 | HR 60 | Temp 97.5°F | Resp 14 | Ht 70.0 in | Wt 218.5 lb

## 2019-05-30 DIAGNOSIS — D696 Thrombocytopenia, unspecified: Secondary | ICD-10-CM

## 2019-05-30 DIAGNOSIS — D693 Immune thrombocytopenic purpura: Secondary | ICD-10-CM | POA: Diagnosis not present

## 2019-05-30 DIAGNOSIS — M545 Low back pain: Secondary | ICD-10-CM | POA: Diagnosis not present

## 2019-05-30 DIAGNOSIS — K921 Melena: Secondary | ICD-10-CM | POA: Insufficient documentation

## 2019-05-30 LAB — CBC WITH DIFFERENTIAL/PLATELET
Abs Immature Granulocytes: 0.04 10*3/uL (ref 0.00–0.07)
Basophils Absolute: 0.1 10*3/uL (ref 0.0–0.1)
Basophils Relative: 1 %
Eosinophils Absolute: 0.1 10*3/uL (ref 0.0–0.5)
Eosinophils Relative: 2 %
HCT: 47.4 % (ref 39.0–52.0)
Hemoglobin: 15.6 g/dL (ref 13.0–17.0)
Immature Granulocytes: 1 %
Lymphocytes Relative: 30 %
Lymphs Abs: 2.5 10*3/uL (ref 0.7–4.0)
MCH: 30.1 pg (ref 26.0–34.0)
MCHC: 32.9 g/dL (ref 30.0–36.0)
MCV: 91.5 fL (ref 80.0–100.0)
Monocytes Absolute: 0.5 10*3/uL (ref 0.1–1.0)
Monocytes Relative: 6 %
Neutro Abs: 5.2 10*3/uL (ref 1.7–7.7)
Neutrophils Relative %: 60 %
Platelets: 58 10*3/uL — ABNORMAL LOW (ref 150–400)
RBC: 5.18 MIL/uL (ref 4.22–5.81)
RDW: 12.9 % (ref 11.5–15.5)
WBC: 8.4 10*3/uL (ref 4.0–10.5)
nRBC: 0 % (ref 0.0–0.2)

## 2019-05-30 LAB — HEPATITIS B SURFACE ANTIBODY,QUALITATIVE: Hep B S Ab: REACTIVE — AB

## 2019-05-30 LAB — COMPREHENSIVE METABOLIC PANEL
ALT: 25 U/L (ref 0–44)
AST: 23 U/L (ref 15–41)
Albumin: 4.1 g/dL (ref 3.5–5.0)
Alkaline Phosphatase: 79 U/L (ref 38–126)
Anion gap: 9 (ref 5–15)
BUN: 7 mg/dL (ref 6–20)
CO2: 26 mmol/L (ref 22–32)
Calcium: 8.5 mg/dL — ABNORMAL LOW (ref 8.9–10.3)
Chloride: 99 mmol/L (ref 98–111)
Creatinine, Ser: 0.89 mg/dL (ref 0.61–1.24)
GFR calc Af Amer: >60 mL/min (ref >60)
GFR calc non Af Amer: >60 mL/min (ref >60)
Glucose, Bld: 101 mg/dL — ABNORMAL HIGH (ref 70–99)
Potassium: 3.9 mmol/L (ref 3.5–5.1)
Sodium: 134 mmol/L — ABNORMAL LOW (ref 135–145)
Total Bilirubin: 0.6 mg/dL (ref 0.3–1.2)
Total Protein: 7.5 g/dL (ref 6.5–8.1)

## 2019-05-30 LAB — HEPATITIS B SURFACE ANTIGEN: Hepatitis B Surface Ag: NONREACTIVE

## 2019-05-30 LAB — LACTATE DEHYDROGENASE: LDH: 157 U/L (ref 98–192)

## 2019-05-30 LAB — HEPATITIS C ANTIBODY: HCV Ab: NONREACTIVE

## 2019-05-30 LAB — HEPATITIS B CORE ANTIBODY, TOTAL: Hep B Core Total Ab: NONREACTIVE

## 2019-05-30 NOTE — Patient Instructions (Addendum)
[place] at [facility] Discharge Instructions  You were seen today by Dr. Delton Coombes. He went over your history, family history and you've been feeling lately. He will have blood drawn today prior to you leaving the hospital. He will follow up with you via phone visit to go over lab results.   Thank you for choosing [place] at [facility] to provide your oncology and hematology care.  To afford each patient quality time with our provider, please arrive at least 15 minutes before your scheduled appointment time.   If you have a lab appointment with the [place] please come in thru the  Main Entrance and check in at the main information desk  You need to re-schedule your appointment should you arrive 10 or more minutes late.  We strive to give you quality time with our providers, and arriving late affects you and other patients whose appointments are after yours.  Also, if you no show three or more times for appointments you may be dismissed from the clinic at the providers discretion.     Again, thank you for choosing [facility].  Our hope is that these requests will decrease the amount of time that you wait before being seen by our physicians.       _____________________________________________________________  Should you have questions after your visit to [facility], please contact our office at [phone] between the hours of 8:00 a.m. and 4:30 p.m.  Voicemails left after 4:00 p.m. will not be returned until the following business day.  For prescription refill requests, have your pharmacy contact our office and allow 72 hours.    Cancer Center Support Programs:   > Cancer Support Group  2nd Tuesday of the month 1pm-2pm, Journey Room

## 2019-05-30 NOTE — Progress Notes (Signed)
CONSULT NOTE  Patient Care Team: Ginger Organ as PCP - General (Physician Assistant) Derek Jack, MD as Consulting Physician (Hematology)  CHIEF COMPLAINTS/PURPOSE OF CONSULTATION:  Chronic immune thrombocytopenia.  HISTORY OF PRESENTING ILLNESS:  Nicholas Marquez 49 y.o. male is seen at the request of Collene Mares, [place] for establishing care for immune thrombocytopenia close to home.  He was reportedly diagnosed with immune thrombocytopenia in 2013 and followed up at [facility].  He required immunoglobulin on 07/19/2014 and also treated with dexamethasone 40 mg for 4 days on 09/05/2014.  I have reviewed records from [facility].  Platelet count on 09/10/2014 was 229, dropped down to 47 on 09/29/2014.  Labs from Dr. Nolon Rod office on 04/11/2019 shows platelet count 66.  His usual symptoms when his platelet count drops is easy bruising, feeling sluggish and headaches.  He denies any easy bruising or bleeding at this time.  He does report some occasional rectal bleeding and had a colonoscopy last year at [place] and reportedly polyps were removed.  He has prednisone 10 mg tablets at home and takes it daily for 3 days as needed.  Last took it in November 2020.  Denies any fevers, night sweats or weight loss.  He was also started on Truvada prep by his infectious disease doctor for prophylaxis.  He reports degenerative joint disease resulting in left upper quadrant pain/discomfort for several years which has not changed.  He also has history of hiatal hernia.  Denies any prior transfusion history.  Denies any previous bone marrow biopsies.  Family history significant for maternal grandmother with lupus and his sister who died of brain tumor at age 58.  Denies drinking tonic water or using quinine supplements.  Appetite is 100%.  Energy levels are 75%.  Low back pain is rated as 4 out of 10.  MEDICAL HISTORY:  Past Medical History:  Diagnosis Date  . Anxiety   . Back pain   .  Degenerative joint disease   . Depression   . High triglycerides   . PTSD (post-traumatic stress disorder)   . PTSD (post-traumatic stress disorder)   . PTSD (post-traumatic stress disorder) 04/19/2019  . PTSD (post-traumatic stress disorder)   . Thrombocytopenia ([place])     SURGICAL HISTORY: Past Surgical History:  Procedure Laterality Date  . TOOTH EXTRACTION    . WISDOM TOOTH EXTRACTION      SOCIAL HISTORY: Social History   Socioeconomic History  . Marital status: Soil scientist    Spouse name: Not on file  . Number of children: 0  . Years of education: Not on file  . Highest education level: Not on file  Occupational History  . Occupation: employed    Comment: Teacher, adult education  Tobacco Use  . Smoking status: Current Every Day Smoker    Packs/day: 1.00    Years: 20.00    Pack years: 20.00    Types: Cigarettes  . Smokeless tobacco: Never Used  Substance and Sexual Activity  . Alcohol use: Yes    Comment: socially  . Drug use: Yes    Comment: THC edibles occasionally  . Sexual activity: Yes  Other Topics Concern  . Not on file  Social History Narrative  . Not on file   Social Determinants of Health   Financial Resource Strain:   . Difficulty of Paying Living Expenses: Not on file  Food Insecurity:   . Worried About Charity fundraiser in the Last Year: Not on file  .  Ran Out of Food in the Last Year: Not on file  Transportation Needs:   . Lack of Transportation (Medical): Not on file  . Lack of Transportation (Non-Medical): Not on file  Physical Activity:   . Days of Exercise per Week: Not on file  . Minutes of Exercise per Session: Not on file  Stress:   . Feeling of Stress : Not on file  Social Connections:   . Frequency of Communication with Friends and Family: Not on file  . Frequency of Social Gatherings with Friends and Family: Not on file  . Attends Religious Services: Not on file  . Active Member of Clubs or Organizations: Not on  file  . Attends Archivist Meetings: Not on file  . Marital Status: Not on file  Intimate Partner Violence:   . Fear of Current or Ex-Partner: Not on file  . Emotionally Abused: Not on file  . Physically Abused: Not on file  . Sexually Abused: Not on file    FAMILY HISTORY: Family History  Problem Relation Age of Onset  . Drug abuse Mother   . Congestive Heart Failure Father   . Diabetes Father   . Peripheral vascular disease Father   . Lupus Maternal Grandmother   . COPD Maternal Grandmother   . Emphysema Maternal Grandmother   . Heart attack Maternal Grandfather   . Diabetes Paternal Grandmother     ALLERGIES:  is allergic to chantix [varenicline tartrate].  MEDICATIONS:  Current Outpatient Medications  Medication Sig Dispense Refill  . emtricitabine-tenofovir (TRUVADA) 200-300 MG tablet Take 1 tablet by mouth daily.    . Vitamin D-Vitamin K (VITAMIN K2-VITAMIN D3) 45-2000 MCG-UNIT CAPS Take 2 tablets by mouth daily.    Marland Kitchen ALPRAZolam (XANAX) 0.25 MG tablet Take 0.25 mg by mouth 2 (two) times daily as needed for anxiety.    . Ascorbic Acid (VITAMIN C) 1000 MG tablet Take 1,000 mg by mouth daily.    . Biotin 2500 MCG CAPS Take 1 capsule by mouth daily.    Marland Kitchen buPROPion (WELLBUTRIN SR) 200 MG 12 hr tablet Take 200 mg by mouth 2 (two) times daily.    . cloNIDine (CATAPRES) 0.1 MG tablet Take 0.1 mg by mouth at bedtime.    Marland Kitchen HYDROcodone-acetaminophen (NORCO/VICODIN) 5-325 MG per tablet Take 1 tablet by mouth 4 (four) times daily.    Javier Docker Oil 300 MG CAPS Take 300 mg by mouth daily.    . traZODone (DESYREL) 50 MG tablet Take 50 mg by mouth at bedtime.     Marland Kitchen zinc gluconate 50 MG tablet Take 50 mg by mouth daily.     No current facility-administered medications for this visit.    REVIEW OF SYSTEMS:   Constitutional: Denies fevers, chills or abnormal night sweats Eyes: Denies blurriness of vision, double vision or watery eyes Ears, nose, mouth, throat, and face:  Denies mucositis or sore throat Respiratory: Denies cough, dyspnea or wheezes Cardiovascular: Denies palpitation, chest discomfort or lower extremity swelling Gastrointestinal:  Denies nausea, heartburn or change in bowel habits Skin: Denies abnormal skin rashes Lymphatics: Denies new lymphadenopathy or easy bruising Neurological:Denies numbness, tingling or new weaknesses Behavioral/Psych: Mood is stable, no new changes  All other systems were reviewed with the patient and are negative.  PHYSICAL EXAMINATION: ECOG PERFORMANCE STATUS: 0 - Asymptomatic  Vitals:   05/30/19 1343  BP: 108/77  Pulse: 60  Resp: 14  Temp: (!) 97.5 F (36.4 C)  SpO2: 100%   Filed Weights  05/30/19 1343  Weight: 218 lb 8 oz (99.1 kg)    GENERAL:alert, no distress and comfortable SKIN: skin color, texture, turgor are normal, no rashes or significant lesions EYES: normal, conjunctiva are pink and non-injected, sclera clear OROPHARYNX:no exudate, no erythema and lips, buccal mucosa, and tongue normal  NECK: supple, thyroid normal size, non-tender, without nodularity LYMPH:  no palpable lymphadenopathy in the cervical, axillary or inguinal LUNGS: clear to auscultation and percussion with normal breathing effort HEART: regular rate & rhythm and no murmurs and no lower extremity edema ABDOMEN:abdomen soft, non-tender and normal bowel sounds Musculoskeletal:no cyanosis of digits and no clubbing  PSYCH: alert & oriented x 3 with fluent speech NEURO: no focal motor/sensory deficits  LABORATORY DATA:  I have reviewed the data as listed Recent Results (from the past 2160 hour(s))  CBC with Differential/Platelet     Status: Abnormal   Collection Time: 05/30/19  3:07 PM  Result Value Ref Range   WBC 8.4 4.0 - 10.5 K/uL   RBC 5.18 4.22 - 5.81 MIL/uL   Hemoglobin 15.6 13.0 - 17.0 g/dL   HCT 47.4 39.0 - 52.0 %   MCV 91.5 80.0 - 100.0 fL   MCH 30.1 26.0 - 34.0 pg   MCHC 32.9 30.0 - 36.0 g/dL   RDW 12.9  11.5 - 15.5 %   Platelets 58 (L) 150 - 400 K/uL    Comment: PLATELET COUNT CONFIRMED BY SMEAR SPECIMEN CHECKED FOR CLOTS Immature Platelet Fraction may be clinically indicated, consider ordering this additional test [id number]    nRBC 0.0 0.0 - 0.2 %   Neutrophils Relative % 60 %   Neutro Abs 5.2 1.7 - 7.7 K/uL   Lymphocytes Relative 30 %   Lymphs Abs 2.5 0.7 - 4.0 K/uL   Monocytes Relative 6 %   Monocytes Absolute 0.5 0.1 - 1.0 K/uL   Eosinophils Relative 2 %   Eosinophils Absolute 0.1 0.0 - 0.5 K/uL   Basophils Relative 1 %   Basophils Absolute 0.1 0.0 - 0.1 K/uL   Immature Granulocytes 1 %   Abs Immature Granulocytes 0.04 0.00 - 0.07 K/uL    Comment: Performed at [facility], [address], [place], [place] [address]  Comprehensive metabolic panel     Status: Abnormal   Collection Time: 05/30/19  3:07 PM  Result Value Ref Range   Sodium 134 (L) 135 - 145 mmol/L   Potassium 3.9 3.5 - 5.1 mmol/L   Chloride 99 98 - 111 mmol/L   CO2 26 22 - 32 mmol/L   Glucose, Bld 101 (H) 70 - 99 mg/dL   BUN 7 6 - 20 mg/dL   Creatinine, Ser 0.89 0.61 - 1.24 mg/dL   Calcium 8.5 (L) 8.9 - 10.3 mg/dL   Total Protein 7.5 6.5 - 8.1 g/dL   Albumin 4.1 3.5 - 5.0 g/dL   AST 23 15 - 41 U/L   ALT 25 0 - 44 U/L   Alkaline Phosphatase 79 38 - 126 U/L   Total Bilirubin 0.6 0.3 - 1.2 mg/dL   GFR calc non Af Amer >60 >60 mL/min   GFR calc Af Amer >60 >60 mL/min   Anion gap 9 5 - 15    Comment: Performed at [facility], [address], [place], [place] [address]  Lactate dehydrogenase     Status: None   Collection Time: 05/30/19  3:07 PM  Result Value Ref Range   LDH 157 98 - 192 U/L    Comment: Performed at  [address] [place], [place] [address]  ANA, IFA (with reflex)     Status: None   Collection Time: 05/30/19  3:07 PM  Result Value Ref Range   ANA Ab, IFA Negative     Comment: (NOTE)                                     Negative   <1:80                                      Borderline  1:80                                     Positive   >1:80 Performed At: [facility] [place], [place] [id number] Rush Farmer MD [id number]   Rheumatoid factor     Status: None   Collection Time: 05/30/19  3:07 PM  Result Value Ref Range   Rhuematoid fact SerPl-aCnc <10.0 0.0 - 13.9 IU/mL    Comment: (NOTE) Performed At: [facility] [place], [place] [id number] Rush Farmer MD [id number]   Hepatitis B surface antibody,qualitative     Status: Abnormal   Collection Time: 05/30/19  3:07 PM  Result Value Ref Range   Hep B S Ab Reactive (A) NON REACTIVE    Comment: (NOTE) Consistent with immunity, greater than 9.9 mIU/mL. Performed at [facility], [place] [address], [place], [place] [address]   Hepatitis B core antibody, total     Status: None   Collection Time: 05/30/19  3:07 PM  Result Value Ref Range   Hep B Core Total Ab NON REACTIVE NON REACTIVE    Comment: Performed at [facility] [address] [place], [place] [address]  Hepatitis C Antibody     Status: None   Collection Time: 05/30/19  3:07 PM  Result Value Ref Range   HCV Ab NON REACTIVE NON REACTIVE    Comment: (NOTE) Nonreactive HCV antibody screen is consistent with no HCV infections,  unless recent infection is suspected or other evidence exists to indicate HCV infection. Performed at [facility], [place] [address] [place], [place] [address]   Hepatitis B surface antigen     Status: None   Collection Time: 05/30/19  3:08 PM  Result Value Ref Range   Hepatitis B Surface Ag NON REACTIVE NON REACTIVE    Comment: Performed at [facility] [address] [place],  [address]    RADIOGRAPHIC STUDIES: I have personally reviewed the radiological images as listed and agreed with the findings in the report.  ASSESSMENT & PLAN:  Chronic ITP (idiopathic thrombocytopenic purpura) (HCC) 1.  Chronic ITP: -Initial  diagnosis in 2013 at [facility]. -Required IVIG 100 g on 07/19/2014.  Also treated with dexamethasone 40 mg x 4 days on 09/05/2014. -Labs reviewed by me from 09/10/2014 shows platelet count 229.  On 09/29/2014 platelet count was 47. -Recent labs on 04/11/2019 shows platelet count 66. -He reportedly has prednisone 10 mg at home and takes it daily for 3 days as needed.  Last took it in November 2020. -Reported occasional blood in the stools.  Colonoscopy last year at [facility] was reportedly  normal except polyp removed. -Never had history of blood transfusion.  Did not have a bone marrow biopsy. -Usual symptoms of his ITP or easy bruising, feeling sluggish and headaches.  He does not report any of those at this time. -Physical examination today did not reveal any palpable adenopathy or splenomegaly. -I will repeat his labs and review them.  We will also check for connective tissue disorders and hepatitis serology.  We will also check an LDH level. -He is reportedly taking Truvada prep prescribed by his ID doctor.  We will follow up to see if it is affecting his platelet count. -We will schedule him for a phone visit in 10 days to discuss results and further follow-up.     All questions were answered. The patient knows to call the clinic with any problems, questions or concerns.      Derek Jack, MD 06/01/19 3:25 PM

## 2019-05-31 LAB — RHEUMATOID FACTOR: Rhuematoid fact SerPl-aCnc: <10 IU/mL (ref 0.0–13.9)

## 2019-05-31 LAB — ANTINUCLEAR ANTIBODIES, IFA: ANA Ab, IFA: NEGATIVE

## 2019-06-01 ENCOUNTER — Encounter (HOSPITAL_COMMUNITY): Payer: Self-pay | Admitting: Hematology

## 2019-06-01 DIAGNOSIS — D693 Immune thrombocytopenic purpura: Secondary | ICD-10-CM | POA: Insufficient documentation

## 2019-06-01 NOTE — Assessment & Plan Note (Signed)
1.  Chronic ITP: -Initial diagnosis in 2013 at [facility]. -Required IVIG 100 g on 07/19/2014.  Also treated with dexamethasone 40 mg x 4 days on 09/05/2014. -Labs reviewed by me from 09/10/2014 shows platelet count 229.  On 09/29/2014 platelet count was 47. -Recent labs on 04/11/2019 shows platelet count 66. -He reportedly has prednisone 10 mg at home and takes it daily for 3 days as needed.  Last took it in November 2020. -Reported occasional blood in the stools.  Colonoscopy last year at [facility] was reportedly normal except polyp removed. -Never had history of blood transfusion.  Did not have a bone marrow biopsy. -Usual symptoms of his ITP or easy bruising, feeling sluggish and headaches.  He does not report any of those at this time. -Physical examination today did not reveal any palpable adenopathy or splenomegaly. -I will repeat his labs and review them.  We will also check for connective tissue disorders and hepatitis serology.  We will also check an LDH level. -He is reportedly taking Truvada prep prescribed by his ID doctor.  We will follow up to see if it is affecting his platelet count. -We will schedule him for a phone visit in 10 days to discuss results and further follow-up.

## 2019-06-11 ENCOUNTER — Inpatient Hospital Stay (HOSPITAL_COMMUNITY): Payer: No Typology Code available for payment source | Attending: Hematology | Admitting: Hematology

## 2019-06-11 ENCOUNTER — Encounter (HOSPITAL_COMMUNITY): Payer: Self-pay | Admitting: Hematology

## 2019-06-11 ENCOUNTER — Other Ambulatory Visit: Payer: Self-pay

## 2019-06-11 DIAGNOSIS — D693 Immune thrombocytopenic purpura: Secondary | ICD-10-CM | POA: Diagnosis not present

## 2019-06-11 NOTE — Progress Notes (Signed)
Virtual Visit via Telephone Note  I connected with Nicholas Marquez on 06/11/19 at  4:05 PM EST by telephone and verified that I am speaking with the correct person using two identifiers.   I discussed the limitations, risks, security and privacy concerns of performing an evaluation and management service by telephone and the availability of in person appointments. I also discussed with the patient that there may be a patient responsible charge related to this service. The patient expressed understanding and agreed to proceed.   History of Present Illness: Nicholas Marquez was seen for chronic ITP.  He has initial diagnosis in 2013 at [facility].  He was treated with IVIG 100 g on 07/19/2014 and dexamethasone 40 mg x 4 days on 09/05/2014.  He usually has easy bruising, feeling sluggish and headaches when his platelets are low.   Observations/Objective: Denies any bleeding per rectum or melena.  Appetite and energy levels are 75%.  No new onset pains reported.  No fevers, night sweats or weight loss.  Assessment and Plan:  1.  Chronic ITP: -I have reviewed his labs dated 05/30/2019.  Platelets are 58.  Hemoglobin and white count was normal.  ANA and rheumatoid factor was normal.  Hepatitis B and C serology was negative.  LFTs were within normal limits. -I have recommended follow-up in 4 months.  If the platelets continue to be above 50 at that time, we will consider 20-month visits. -He was told to come back sooner should he develop any easy bruising or bleeding.   Follow Up Instructions: RTC 4 months with labs.   I discussed the assessment and treatment plan with the patient. The patient was provided an opportunity to ask questions and all were answered. The patient agreed with the plan and demonstrated an understanding of the instructions.   The patient was advised to call back or seek an in-person evaluation if the symptoms worsen or if the condition fails to improve as anticipated.  I  provided 8 minutes of non-face-to-face time during this encounter.   Derek Jack, MD

## 2019-10-10 ENCOUNTER — Inpatient Hospital Stay (HOSPITAL_COMMUNITY): Payer: Non-veteran care | Attending: Hematology

## 2019-10-17 ENCOUNTER — Telehealth (HOSPITAL_COMMUNITY): Payer: Non-veteran care | Admitting: Nurse Practitioner

## 2021-04-10 ENCOUNTER — Other Ambulatory Visit: Payer: Self-pay

## 2021-04-10 ENCOUNTER — Ambulatory Visit
Admission: RE | Admit: 2021-04-10 | Discharge: 2021-04-10 | Disposition: A | Discharge status: Home / Self Care | Payer: Non-veteran care | Source: Ambulatory Visit | Attending: Physician Assistant | Admitting: Physician Assistant

## 2021-04-10 DIAGNOSIS — Z202 Contact with and (suspected) exposure to infections with a predominantly sexual mode of transmission: Secondary | ICD-10-CM

## 2021-04-10 MED ORDER — CEFTRIAXONE SODIUM 500 MG IJ SOLR
500.0000 mg | Freq: Once | INTRAMUSCULAR | Status: AC
  Administered 2021-04-10: 500 mg via INTRAMUSCULAR

## 2021-04-10 NOTE — Discharge Instructions (Signed)
No test of cure indicated Return if you develop symptoms.  Avoid sexual intercourse for 7 days after treatment and only resume if symptoms for both partners has resolved

## 2021-04-10 NOTE — ED Provider Notes (Signed)
[facility]    CSN: [id number] Arrival date & time: 04/10/21  0905      History   Chief Complaint No chief complaint on file.   HPI [name] is a 50 y.o. male.   Pt presents for treatment of gonorrhea.  He reports his partner tested positive for gonorrhea and was treated in November.  Pt was seen by the [facility] and treated for gonorrhea and chlamydia.  He reports several days ago his partner developed new penile discharge and [facility] and [facility] for gon[facility]hea.  Pt is asymptomatic at this time, requesting treatment as partner was just treated again.     Past Medical History:  Diagnosis Date   Anxiety    Back pain    Degenerative joint disease    Depression    High triglycerides    PTSD (post-traumatic stress disorder)    PTSD (post-traumatic stress disorder)    PTSD (post-traumatic stress disorder) 04/19/2019   PTSD (post-traumatic stress disorder)    Thrombocytopenia (HCC)     Patient Active Problem List   Diagnosis Date Noted   Chronic ITP (idiopathic thrombocytopenic purpura) ([place]) 06/01/2019   UNSPECIFIED MONONEURITIS OF UPPER LIMB 10/06/2009   SHOULDER PAIN 10/06/2009    Past Surgical History:  Procedure Laterality Date   TOOTH EXTRACTION     WISDOM TOOTH EXTRACTION         Home Medications    Prior to Admission medications   Medication Sig Start Date End Date Taking? Authorizing Provider  ALPRAZolam (XANAX) 0.25 MG tablet Take 0.25 mg by mouth 2 (two) times daily as needed for anxiety.    [provider]  Ascorbic Acid (VITAMIN C) 1000 MG tablet Take 1,000 mg by mouth daily.    [provider]  Biotin 2500 MCG CAPS Take 1 capsule by mouth daily.    [provider]  buPROPion (WELLBUTRIN SR) 200 MG 12 hr tablet Take 200 mg by mouth 2 (two) times daily.    [provider]  cloNIDine (CATAPRES) 0.1 MG tablet Take 0.1 mg by mouth at bedtime.    [provider]   emtricitabine-tenofovir (TRUVADA) 200-300 MG tablet Take 1 tablet by mouth daily.    [provider]  HYDROcodone-acetaminophen (NORCO/VICODIN) 5-325 MG per tablet Take 1 tablet by mouth 4 (four) times daily.    [provider]  Javier Docker Oil 300 MG CAPS Take 300 mg by mouth daily.    [provider]  traZODone (DESYREL) 50 MG tablet Take 50 mg by mouth at bedtime.     [provider]  Vitamin D-Vitamin K (VITAMIN K2-VITAMIN D3) 45-2000 MCG-UNIT CAPS Take 2 tablets by mouth daily.    [provider]  zinc gluconate 50 MG tablet Take 50 mg by mouth daily.    [provider]    Family History Family History  Problem Relation Age of Onset   Drug abuse Mother    Congestive Heart Failure Father    Diabetes Father    Peripheral vascular disease Father    Lupus Maternal Grandmother    COPD Maternal Grandmother    Emphysema Maternal Grandmother    Heart attack Maternal Grandfather    Diabetes Paternal Grandmother     Social History Social History   Tobacco Use   Smoking status: Every Day    Packs/day: 1.00    Years: 20.00    Pack years: 20.00    Types: Cigarettes   Smokeless tobacco: Never  Vaping Use   Vaping Use: Every day  Substance Use Topics   Alcohol use: Yes    Comment: socially   Drug use: Yes    Comment: THC edibles occasionally     Allergies   Chantix [varenicline tartrate]   Review of Systems Review of Systems  Constitutional:  Negative for chills and fever.  HENT:  Negative for ear pain and sore throat.   Eyes:  Negative for pain and visual disturbance.  Respiratory:  Negative for cough and shortness of breath.   Cardiovascular:  Negative for chest pain and palpitations.  Gastrointestinal:  Negative for abdominal pain and vomiting.  Genitourinary:  Negative for dysuria and hematuria.  Musculoskeletal:  Negative for arthralgias and back pain.  Skin:  Negative for color change and rash.  Neurological:   Negative for seizures and syncope.  All other systems reviewed and are negative.   Physical Exam Triage Vital Signs ED Triage Vitals [04/10/21 0956]  Enc Vitals Group     BP      Pulse      Resp      Temp      Temp src      SpO2      Weight      Height      Head Circumference      Peak Flow      Pain Score 0     Pain Loc      Pain Edu?      Excl. in [place]?    No data found.  Updated Vital Signs There were no vitals taken for this visit.  Visual Acuity Right Eye Distance:   Left Eye Distance:   Bilateral Distance:    Right Eye Near:   Left Eye Near:    Bilateral Near:     Physical Exam Vitals and nursing note reviewed.  Constitutional:      General: He is not in acute distress.    Appearance: He is well-developed.  HENT:     Head: Normocephalic and atraumatic.  Eyes:     Conjunctiva/sclera: Conjunctivae normal.  Cardiovascular:     Rate and Rhythm: Normal rate and regular rhythm.     Heart sounds: No murmur heard. Pulmonary:     Effort: Pulmonary effort is normal. No respiratory distress.     Breath sounds: Normal breath sounds.  Abdominal:     Palpations: Abdomen is soft.     Tenderness: There is no abdominal tenderness.  Musculoskeletal:        General: No swelling.     Cervical back: Neck supple.  Skin:    General: Skin is warm and dry.     Capillary Refill: Capillary refill takes less than 2 seconds.  Neurological:     Mental Status: He is alert.  Psychiatric:        Mood and Affect: Mood normal.     UC Treatments / Results  Labs (all labs ordered are listed, but only abnormal results are displayed) Labs Reviewed - No data to display  EKG   Radiology No results found.  Procedures Procedures (including critical care time)  Medications Ordered in UC Medications  cefTRIAXone (ROCEPHIN) injection 500 mg (has no administration in time range)    Initial Impression / Assessment and Plan / UC Course  I have reviewed the triage vital  signs and the nursing notes.  Pertinent labs & imaging results that were available during my care of the patient were reviewed by me and considered  in my medical decision making (see chart for details).     Will treat for presumptive gonorrhea as partner was just treated.  Pt is asymptomatic, defers testing today.  Return precautions discussed.  Final Clinical Impressions(s) / UC Diagnoses   Final diagnoses:  STD exposure     Discharge Instructions      No test of cure indicated Return if you develop symptoms.  Avoid sexual intercourse for 7 days after treatment and only resume if symptoms for both partners has resolved     ED Prescriptions   None    PDMP not reviewed this encounter.   Ward, Lenise Arena, PA-C 04/10/21 1014

## 2021-04-10 NOTE — ED Triage Notes (Signed)
Patient states that his partner developed gonharrea and he was not able to get rid of it before they had sex. He states he had a treatment failure.  He went to the [facility] hospital and was treated for his symptoms and he was given a shot.

## 2022-04-18 ENCOUNTER — Other Ambulatory Visit (HOSPITAL_COMMUNITY): Payer: Self-pay | Admitting: Internal Medicine

## 2022-04-18 DIAGNOSIS — R1310 Dysphagia, unspecified: Secondary | ICD-10-CM

## 2022-04-21 ENCOUNTER — Other Ambulatory Visit (HOSPITAL_COMMUNITY): Payer: Self-pay | Admitting: Internal Medicine

## 2022-04-21 ENCOUNTER — Ambulatory Visit (HOSPITAL_COMMUNITY)
Admission: RE | Admit: 2022-04-21 | Discharge: 2022-04-21 | Disposition: A | Discharge status: Home / Self Care | Payer: No Typology Code available for payment source | Source: Ambulatory Visit | Attending: Internal Medicine | Admitting: Internal Medicine

## 2022-04-21 DIAGNOSIS — R1310 Dysphagia, unspecified: Secondary | ICD-10-CM | POA: Insufficient documentation

## 2022-04-21 DIAGNOSIS — K2289 Other specified disease of esophagus: Secondary | ICD-10-CM

## 2022-04-22 ENCOUNTER — Ambulatory Visit (HOSPITAL_COMMUNITY)
Admission: RE | Admit: 2022-04-22 | Discharge: 2022-04-22 | Disposition: A | Discharge status: Home / Self Care | Payer: No Typology Code available for payment source | Source: Ambulatory Visit | Attending: Internal Medicine | Admitting: Internal Medicine

## 2022-04-22 DIAGNOSIS — K2289 Other specified disease of esophagus: Secondary | ICD-10-CM | POA: Insufficient documentation

## 2022-04-22 MED ORDER — IOHEXOL 300 MG/ML  SOLN
75.0000 mL | Freq: Once | INTRAMUSCULAR | Status: AC | PRN
  Administered 2022-04-22: 75 mL via INTRAVENOUS

## 2022-04-28 ENCOUNTER — Ambulatory Visit: Payer: Non-veteran care | Admitting: Hematology

## 2022-05-30 ENCOUNTER — Other Ambulatory Visit: Payer: Self-pay

## 2022-05-30 ENCOUNTER — Encounter (HOSPITAL_COMMUNITY): Payer: Self-pay | Admitting: Emergency Medicine

## 2022-05-30 ENCOUNTER — Emergency Department (HOSPITAL_COMMUNITY): Payer: No Typology Code available for payment source

## 2022-05-30 ENCOUNTER — Emergency Department (HOSPITAL_COMMUNITY)
Admission: EM | Admit: 2022-05-30 | Discharge: 2022-05-31 | Disposition: A | Discharge status: Home / Self Care | Payer: No Typology Code available for payment source | Attending: Emergency Medicine | Admitting: Emergency Medicine

## 2022-05-30 DIAGNOSIS — Z20822 Contact with and (suspected) exposure to covid-19: Secondary | ICD-10-CM | POA: Diagnosis not present

## 2022-05-30 DIAGNOSIS — L03311 Cellulitis of abdominal wall: Secondary | ICD-10-CM | POA: Insufficient documentation

## 2022-05-30 DIAGNOSIS — R509 Fever, unspecified: Secondary | ICD-10-CM | POA: Diagnosis present

## 2022-05-30 HISTORY — DX: Malignant (primary) neoplasm, unspecified: C80.1

## 2022-05-30 LAB — CBC WITH DIFFERENTIAL/PLATELET
Abs Immature Granulocytes: 0.03 10*3/uL (ref 0.00–0.07)
Basophils Absolute: 0 10*3/uL (ref 0.0–0.1)
Basophils Relative: 1 %
Eosinophils Absolute: 0 10*3/uL (ref 0.0–0.5)
Eosinophils Relative: 1 %
HCT: 36.6 % — ABNORMAL LOW (ref 39.0–52.0)
Hemoglobin: 11.5 g/dL — ABNORMAL LOW (ref 13.0–17.0)
Immature Granulocytes: 1 %
Lymphocytes Relative: 23 %
Lymphs Abs: 1.2 10*3/uL (ref 0.7–4.0)
MCH: 27.4 pg (ref 26.0–34.0)
MCHC: 31.4 g/dL (ref 30.0–36.0)
MCV: 87.4 fL (ref 80.0–100.0)
Monocytes Absolute: 0.6 10*3/uL (ref 0.1–1.0)
Monocytes Relative: 12 %
Neutro Abs: 3.3 10*3/uL (ref 1.7–7.7)
Neutrophils Relative %: 62 %
Platelets: 297 10*3/uL (ref 150–400)
RBC: 4.19 MIL/uL — ABNORMAL LOW (ref 4.22–5.81)
RDW: 13 % (ref 11.5–15.5)
WBC: 5.2 10*3/uL (ref 4.0–10.5)
nRBC: 0 % (ref 0.0–0.2)

## 2022-05-30 LAB — BASIC METABOLIC PANEL
Anion gap: 9 (ref 5–15)
BUN: 14 mg/dL (ref 6–20)
CO2: 27 mmol/L (ref 22–32)
Calcium: 8.4 mg/dL — ABNORMAL LOW (ref 8.9–10.3)
Chloride: 97 mmol/L — ABNORMAL LOW (ref 98–111)
Creatinine, Ser: 0.88 mg/dL (ref 0.61–1.24)
GFR, Estimated: >60 mL/min (ref >60)
Glucose, Bld: 123 mg/dL — ABNORMAL HIGH (ref 70–99)
Potassium: 3.6 mmol/L (ref 3.5–5.1)
Sodium: 133 mmol/L — ABNORMAL LOW (ref 135–145)

## 2022-05-30 LAB — RESP PANEL BY RT-PCR (RSV, FLU A&B, COVID)  RVPGX2
Influenza A by PCR: NEGATIVE
Influenza B by PCR: NEGATIVE
Resp Syncytial Virus by PCR: NEGATIVE
SARS Coronavirus 2 by RT PCR: NEGATIVE

## 2022-05-30 MED ORDER — IOHEXOL 300 MG/ML  SOLN
100.0000 mL | Freq: Once | INTRAMUSCULAR | Status: AC | PRN
  Administered 2022-05-30: 100 mL via INTRAVENOUS

## 2022-05-30 NOTE — ED Triage Notes (Signed)
Pt presents with possible G-Tube infection, per pt currently being treated for lung cancer and had feeding tube place about a month ago at the [place], yesterday he noticed discolored discharge from site, site currently slightly red, no drainage notice.

## 2022-05-30 NOTE — ED Provider Notes (Signed)
[facility] Provider Note   CSN: [id number] Arrival date & time: 05/30/22  1616     History No chief complaint on file.   Nicholas Marquez is a 52 y.o. male.  HPI Patient presents to the emergency department with complaints of a possible gastrostomy tube infection.  Patient reports he had gastrostomy tube placed on December 19 at [facility].  He reports that he currently does not receive any chemotherapy for her cancer diagnosis that he recently received.  Patient reported that he had a brief fever yesterday which has since resolved. Denies abdominal pain, nausea, vomiting, diarrhea, chest pain, or shortness of breath.    Home Medications Prior to Admission medications   Medication Sig Start Date End Date Taking? Authorizing Provider  cephALEXin (KEFLEX) 500 MG capsule Take 1 capsule (500 mg total) by mouth 4 (four) times daily. 05/31/22  Yes Luvenia Heller, PA-C  ALPRAZolam (XANAX) 0.25 MG tablet Take 0.25 mg by mouth 2 (two) times daily as needed for anxiety.    [provider]  Ascorbic Acid (VITAMIN C) 1000 MG tablet Take 1,000 mg by mouth daily.    [provider]  Biotin 2500 MCG CAPS Take 1 capsule by mouth daily.    [provider]  buPROPion (WELLBUTRIN SR) 200 MG 12 hr tablet Take 200 mg by mouth 2 (two) times daily.    [provider]  cloNIDine (CATAPRES) 0.1 MG tablet Take 0.1 mg by mouth at bedtime.    [provider]  emtricitabine-tenofovir (TRUVADA) 200-300 MG tablet Take 1 tablet by mouth daily.    [provider]  HYDROcodone-acetaminophen (NORCO/VICODIN) 5-325 MG per tablet Take 1 tablet by mouth 4 (four) times daily.    [provider]  Javier Docker Oil 300 MG CAPS Take 300 mg by mouth daily.    [provider]  traZODone (DESYREL) 50 MG tablet Take 50 mg by mouth at bedtime.     [provider]  Vitamin D-Vitamin K (VITAMIN K2-VITAMIN D3) 45-2000  MCG-UNIT CAPS Take 2 tablets by mouth daily.    [provider]  zinc gluconate 50 MG tablet Take 50 mg by mouth daily.    [provider]      Allergies    Chantix [varenicline tartrate]    Review of Systems   Review of Systems  Constitutional:  Negative for appetite change, chills and fever.  Respiratory:  Negative for choking and chest tightness.   Cardiovascular:  Negative for chest pain.  Gastrointestinal:  Negative for abdominal pain, diarrhea, nausea and vomiting.  Genitourinary:  Negative for dysuria.  All other systems reviewed and are negative.   Physical Exam Updated Vital Signs BP 107/70 (BP Location: Right Arm)   Pulse 88   Temp 98.4 F (36.9 C) (Oral)   Resp 18   Ht '5\' 10"'$  (1.778 m)   Wt 79.4 kg   SpO2 100%   BMI 25.11 kg/m  Physical Exam Vitals and nursing note reviewed.  Constitutional:      Appearance: Normal appearance.  HENT:     Head: Normocephalic and atraumatic.  Eyes:     Conjunctiva/sclera: Conjunctivae normal.  Cardiovascular:     Rate and Rhythm: Normal rate and regular rhythm.     Pulses: Normal pulses.     Heart sounds: Normal heart sounds.  Pulmonary:     Effort: Pulmonary effort is normal.     Breath sounds: Normal breath sounds.  Abdominal:  General: Abdomen is flat. Bowel sounds are normal.     Palpations: Abdomen is soft.  Musculoskeletal:     Cervical back: Normal range of motion.  Skin:    General: Skin is warm and dry.     Capillary Refill: Capillary refill takes less than 2 seconds.     Findings: Rash present.     Comments: Small erythematous patch surrounding gastrostomy tube. No purulence noted.  Neurological:     General: No focal deficit present.     Mental Status: He is alert.  Psychiatric:        Mood and Affect: Mood normal.     ED Results / Procedures / Treatments   Labs (all labs ordered are listed, but only abnormal results are displayed) Labs Reviewed  CBC WITH DIFFERENTIAL/PLATELET  - Abnormal; Notable for the following components:      Result Value   RBC 4.19 (*)    Hemoglobin 11.5 (*)    HCT 36.6 (*)    All other components within normal limits  BASIC METABOLIC PANEL - Abnormal; Notable for the following components:   Sodium 133 (*)    Chloride 97 (*)    Glucose, Bld 123 (*)    Calcium 8.4 (*)    All other components within normal limits  RESP PANEL BY RT-PCR (RSV, FLU A&B, COVID)  RVPGX2    EKG None  Radiology No results found.  Procedures Procedures   Medications Ordered in ED Medications  iohexol (OMNIPAQUE) 300 MG/ML solution 100 mL (100 mLs Intravenous Contrast Given 05/30/22 2231)  cephALEXin (KEFLEX) capsule 500 mg (500 mg Oral Given 05/31/22 0019)    ED Course/ Medical Decision Making/ A&P                           Medical Decision Making Amount and/or Complexity of Data Reviewed Labs: ordered. Radiology: ordered.  Risk Prescription drug management.   This patient presents to the ED for concern of PEG tube infection. Differential diagnosis includes cellulitis, abscess, sepsis   Lab Tests:  I Ordered, and personally interpreted labs.  The pertinent results include:  CBC, CMP, and RVP relatively normal   Imaging Studies ordered:  I ordered imaging studies including CT abdomen  I independently visualized and interpreted imaging which showed no evidence of abdominal abnormalities or abscess I agree with the radiologist interpretation   Medicines ordered and prescription drug management:  I ordered medication including Keflex for cellulitis I have reviewed the patients home medicines and have made adjustments as needed   Problem List / ED Course:  Patient presents emergency department complaints of concern for a PEG tube infection.  He reports that he has noticed some redness around the insertion site as well as what he believes is a purulent discharge draining from the site.  Patient denies any recent fevers the exception of  last night where he felt warm.  Given patient's current known history of cancer, had CT scan of abdomen performed to ensure there was not a spreading systemic infection stemming from the abdomen.  CT scan was reassuring without any evidence of infection or abscess formation.  Infection also and is likely simply cellulitic.  Will treat with 1 dose of Keflex here in the emergency department and prescription for Keflex to be taken outpatient and patient has to follow-up with oncologist for further evaluation.  Final Clinical Impression(s) / ED Diagnoses Final diagnoses:  Cellulitis of abdominal wall    Rx /  DC Orders ED Discharge Orders          Ordered    cephALEXin (KEFLEX) 500 MG capsule  4 times daily        05/31/22 0011              Luvenia Heller, PA-C 06/04/22 0011    Godfrey Pick, MD 06/11/22 [phone]

## 2022-05-31 MED ORDER — CEPHALEXIN 500 MG PO CAPS: 500.0000 mg | ORAL_CAPSULE | Freq: Four times a day (QID) | ORAL | 0 refills | Status: DC

## 2022-05-31 MED ORDER — CEPHALEXIN 500 MG PO CAPS
500.0000 mg | ORAL_CAPSULE | Freq: Once | ORAL | Status: AC
  Administered 2022-05-31: 500 mg via ORAL
  Filled 2022-05-31: qty 1

## 2022-05-31 NOTE — Discharge Instructions (Addendum)
You were seen in the emergency department tonight for a skin infection surrounding your gastrostomy tube. Thankfully your CT imaging did not show signs that this infection was coming from internally in your abdomen so this is likely a superficial skin infection. You were given a dose of Keflex here in the emergency department. I have also sent in a 5 day course of Keflex to your pharmacy which you will take four times daily for the next 5 days. If you notice worsening redness or increased drainage coming from the tube site, make sure you follow up either with your oncologist or come back into the ER for further evaluation.

## 2022-08-26 ENCOUNTER — Other Ambulatory Visit: Payer: Self-pay

## 2022-08-26 ENCOUNTER — Emergency Department (HOSPITAL_COMMUNITY)
Admission: EM | Admit: 2022-08-26 | Discharge: 2022-08-26 | Disposition: A | Discharge status: Home / Self Care | Payer: No Typology Code available for payment source | Attending: Emergency Medicine | Admitting: Emergency Medicine

## 2022-08-26 ENCOUNTER — Encounter (HOSPITAL_COMMUNITY): Payer: Self-pay | Admitting: Emergency Medicine

## 2022-08-26 DIAGNOSIS — R21 Rash and other nonspecific skin eruption: Secondary | ICD-10-CM | POA: Insufficient documentation

## 2022-08-26 DIAGNOSIS — C3490 Malignant neoplasm of unspecified part of unspecified bronchus or lung: Secondary | ICD-10-CM | POA: Insufficient documentation

## 2022-08-26 DIAGNOSIS — T50905A Adverse effect of unspecified drugs, medicaments and biological substances, initial encounter: Secondary | ICD-10-CM

## 2022-08-26 DIAGNOSIS — T3695XA Adverse effect of unspecified systemic antibiotic, initial encounter: Secondary | ICD-10-CM | POA: Insufficient documentation

## 2022-08-26 LAB — COMPREHENSIVE METABOLIC PANEL
ALT: 18 U/L (ref 0–44)
AST: 22 U/L (ref 15–41)
Albumin: 3.5 g/dL (ref 3.5–5.0)
Alkaline Phosphatase: 80 U/L (ref 38–126)
Anion gap: 8 (ref 5–15)
BUN: 6 mg/dL (ref 6–20)
CO2: 28 mmol/L (ref 22–32)
Calcium: 8.9 mg/dL (ref 8.9–10.3)
Chloride: 101 mmol/L (ref 98–111)
Creatinine, Ser: 1.36 mg/dL — ABNORMAL HIGH (ref 0.61–1.24)
GFR, Estimated: >60 mL/min (ref >60)
Glucose, Bld: 85 mg/dL (ref 70–99)
Potassium: 3.1 mmol/L — ABNORMAL LOW (ref 3.5–5.1)
Sodium: 137 mmol/L (ref 135–145)
Total Bilirubin: 0.1 mg/dL — ABNORMAL LOW (ref 0.3–1.2)
Total Protein: 7.3 g/dL (ref 6.5–8.1)

## 2022-08-26 LAB — RPR: RPR Ser Ql: NONREACTIVE

## 2022-08-26 LAB — CBC WITH DIFFERENTIAL/PLATELET
Abs Immature Granulocytes: 0 10*3/uL (ref 0.00–0.07)
Basophils Absolute: 0 10*3/uL (ref 0.0–0.1)
Basophils Relative: 0 %
Eosinophils Absolute: 0 10*3/uL (ref 0.0–0.5)
Eosinophils Relative: 2 %
HCT: 24.1 % — ABNORMAL LOW (ref 39.0–52.0)
Hemoglobin: 7.7 g/dL — ABNORMAL LOW (ref 13.0–17.0)
Lymphocytes Relative: 62 %
Lymphs Abs: 1.2 10*3/uL (ref 0.7–4.0)
MCH: 28.9 pg (ref 26.0–34.0)
MCHC: 32 g/dL (ref 30.0–36.0)
MCV: 90.6 fL (ref 80.0–100.0)
Monocytes Absolute: 0.2 10*3/uL (ref 0.1–1.0)
Monocytes Relative: 11 %
Neutro Abs: 0.5 10*3/uL — ABNORMAL LOW (ref 1.7–7.7)
Neutrophils Relative %: 25 %
Platelets: 123 10*3/uL — ABNORMAL LOW (ref 150–400)
RBC: 2.66 MIL/uL — ABNORMAL LOW (ref 4.22–5.81)
RDW: 17.4 % — ABNORMAL HIGH (ref 11.5–15.5)
WBC Morphology: REACTIVE
WBC: 1.9 10*3/uL — ABNORMAL LOW (ref 4.0–10.5)
nRBC: 0 % (ref 0.0–0.2)

## 2022-08-26 LAB — URINALYSIS, ROUTINE W REFLEX MICROSCOPIC
Bilirubin Urine: NEGATIVE
Glucose, UA: NEGATIVE mg/dL
Hgb urine dipstick: NEGATIVE
Ketones, ur: NEGATIVE mg/dL
Leukocytes,Ua: NEGATIVE
Nitrite: NEGATIVE
Protein, ur: NEGATIVE mg/dL
Specific Gravity, Urine: 1.003 — ABNORMAL LOW (ref 1.005–1.030)
pH: 6 (ref 5.0–8.0)

## 2022-08-26 LAB — PROTIME-INR
INR: 1.3 — ABNORMAL HIGH (ref 0.8–1.2)
Prothrombin Time: 15.6 seconds — ABNORMAL HIGH (ref 11.4–15.2)

## 2022-08-26 LAB — HIV ANTIBODY (ROUTINE TESTING W REFLEX): HIV Screen 4th Generation wRfx: NONREACTIVE

## 2022-08-26 MED ORDER — IBUPROFEN 800 MG PO TABS: 800.0000 mg | ORAL_TABLET | Freq: Once | ORAL | Status: DC

## 2022-08-26 MED ORDER — DIPHENHYDRAMINE HCL 25 MG PO CAPS
25.0000 mg | ORAL_CAPSULE | Freq: Once | ORAL | Status: AC
  Administered 2022-08-26: 25 mg via ORAL
  Filled 2022-08-26: qty 1

## 2022-08-26 NOTE — ED Notes (Signed)
ED Provider at bedside. 

## 2022-08-26 NOTE — ED Triage Notes (Signed)
Pt arrives POV c/o blisters that he noticed last night to his groin. Pt is currently receiving chemo and was placed on oral abx.

## 2022-08-26 NOTE — Discharge Instructions (Signed)
Please stop the new medications and call the prescribing doctor for changes and follow up. Please return to the ED for any new or worsening symptoms but specifically if the rash is rapidly spreading, fevers, blood from your mouth, rectum, penis or elsewhere.

## 2022-08-27 NOTE — ED Provider Notes (Signed)
[place] EMERGENCY DEPARTMENT AT [facility] Provider Note   CSN: [id number] Arrival date & time: 08/26/22  0415     History  Chief Complaint  Patient presents with   Blister    Nicholas Marquez is a 52 y.o. male.  52 year old male who presents ER today secondary to rash.  Patient has a history of non-small cell lung cancer being treated between the [place] and [facility].  Sounds like he had a swollen lymph node at 1 point that and pended on his esophagus he was having difficulty swallowing.  It got better but he was still having some issues so he went in to get seen on Monday.  His esophagus had normal sequelae of the previous lymph node but no obvious obstruction sounds like he was started on fluconazole that day prophylactically and then started on Levaquin yesterday also prophylactically no indication that he has a true infection from either 1 of these things that he knows of.  Patient also takes immune therapy and is on Biktarvy for HIV prevention but does not have HIV or AIDS.  He states that yesterday he started developing erythema to bilateral lower extremities.  Just prior to that he had itching on his right hand that also turned into some type of erythematous rash.  He also has a lesion on his penis.  He has been with his partner for 17 years but has been sexually active in some time.  He wonders if these rashes are related to medications that he started.        Home Medications Prior to Admission medications   Medication Sig Start Date End Date Taking? Authorizing Provider  ALPRAZolam (XANAX) 0.25 MG tablet Take 0.25 mg by mouth 2 (two) times daily as needed for anxiety.    [provider]  Ascorbic Acid (VITAMIN C) 1000 MG tablet Take 1,000 mg by mouth daily.    [provider]  Biotin 2500 MCG CAPS Take 1 capsule by mouth daily.    [provider]  buPROPion (WELLBUTRIN SR) 200 MG 12 hr tablet Take 200 mg by mouth 2 (two) times daily.     [provider]  cephALEXin (KEFLEX) 500 MG capsule Take 1 capsule (500 mg total) by mouth 4 (four) times daily. 05/31/22   Smitty Knudsen, PA-C  cloNIDine (CATAPRES) 0.1 MG tablet Take 0.1 mg by mouth at bedtime.    [provider]  emtricitabine-tenofovir (TRUVADA) 200-300 MG tablet Take 1 tablet by mouth daily.    [provider]  HYDROcodone-acetaminophen (NORCO/VICODIN) 5-325 MG per tablet Take 1 tablet by mouth 4 (four) times daily.    [provider]  Boris Lown Oil 300 MG CAPS Take 300 mg by mouth daily.    [provider]  traZODone (DESYREL) 50 MG tablet Take 50 mg by mouth at bedtime.     [provider]  Vitamin D-Vitamin K (VITAMIN K2-VITAMIN D3) 45-2000 MCG-UNIT CAPS Take 2 tablets by mouth daily.    [provider]  zinc gluconate 50 MG tablet Take 50 mg by mouth daily.    [provider]      Allergies    Chantix [varenicline tartrate]    Review of Systems   Review of Systems  Physical Exam Updated Vital Signs BP 106/74   Pulse 80   Temp 97.9 F (36.6 C) (Oral)   Resp 16   Ht  (1.778 m)   Wt 79.4 kg   SpO2 100%  BMI 25.12 kg/m  Physical Exam Vitals and nursing note reviewed.  Constitutional:      Appearance: He is well-developed.  HENT:     Head: Normocephalic and atraumatic.  Eyes:     Pupils: Pupils are equal, round, and reactive to light.  Cardiovascular:     Rate and Rhythm: Normal rate.  Pulmonary:     Effort: Pulmonary effort is normal. No respiratory distress.  Abdominal:     General: Abdomen is flat. There is no distension.  Musculoskeletal:        General: Normal range of motion.     Cervical back: Normal range of motion.  Skin:    General: Skin is warm and dry.     Findings: Rash present.     Comments: Erythema without warmth or tenderness to bilateral lower extremities just below his knees. Edema. Also has what looks like a deroofed vesicle to his penile head. Also has  2 erythematous areas 1 to his right palm that has a purplish hue in the middle and then a very small lightly erythematous rash to his left volar wrist that is mildly pruritic but not painful.  No mucous membrane involvement.  No hematuria, blood in his stools oral bleeding or nosebleeds.  Neurological:     General: No focal deficit present.     Mental Status: He is alert.     ED Results / Procedures / Treatments   Labs (all labs ordered are listed, but only abnormal results are displayed) Labs Reviewed  CBC WITH DIFFERENTIAL/PLATELET - Abnormal; Notable for the following components:      Result Value   WBC 1.9 (*)    RBC 2.66 (*)    Hemoglobin 7.7 (*)    HCT 24.1 (*)    RDW 17.4 (*)    Platelets 123 (*)    Neutro Abs 0.5 (*)    All other components within normal limits  COMPREHENSIVE METABOLIC PANEL - Abnormal; Notable for the following components:   Potassium 3.1 (*)    Creatinine, Ser 1.36 (*)    Total Bilirubin 0.1 (*)    All other components within normal limits  PROTIME-INR - Abnormal; Notable for the following components:   Prothrombin Time 15.6 (*)    INR 1.3 (*)    All other components within normal limits  URINALYSIS, ROUTINE W REFLEX MICROSCOPIC - Abnormal; Notable for the following components:   Color, Urine STRAW (*)    Specific Gravity, Urine 1.003 (*)    All other components within normal limits  RPR  HIV ANTIBODY (ROUTINE TESTING W REFLEX)  HSV 1/2 PCR (SURFACE)    EKG None  Radiology No results found.  Procedures Procedures    Medications Ordered in ED Medications  diphenhydrAMINE (BENADRYL) capsule 25 mg (25 mg Oral Given 08/26/22 1610)    ED Course/ Medical Decision Making/ A&P                             Medical Decision Making Amount and/or Complexity of Data Reviewed Labs: ordered.   No evidence that this rash is a truly emergent rash.  There is no evidence to suggest it is Stevens-Johnson's or TE N.  Also considered possible DRESS  however his eosinophils are normal.  It does seem to be temporally related to the fluconazole more than the levofloxacin.  As these were prophylactic anyway we will suggest he stops taking the nose and follow-up with his primary team  later in the day to figure out a plan. Will return here for any worsening or other systemic symptoms.    Final Clinical Impression(s) / ED Diagnoses Final diagnoses:  Adverse effect of drug, initial encounter  Rash    Rx / DC Orders ED Discharge Orders     None         Jebidiah Baggerly, Barbara Cower, MD 08/27/22 [phone]

## 2022-08-28 LAB — HSV 1/2 PCR (SURFACE)
HSV-1 DNA: NOT DETECTED
HSV-2 DNA: NOT DETECTED

## 2023-01-02 ENCOUNTER — Ambulatory Visit (INDEPENDENT_AMBULATORY_CARE_PROVIDER_SITE_OTHER): Payer: No Typology Code available for payment source | Admitting: Orthopedic Surgery

## 2023-01-02 ENCOUNTER — Encounter: Payer: Self-pay | Admitting: Orthopedic Surgery

## 2023-01-02 ENCOUNTER — Other Ambulatory Visit (INDEPENDENT_AMBULATORY_CARE_PROVIDER_SITE_OTHER): Payer: No Typology Code available for payment source

## 2023-01-02 VITALS — BP 108/70 | HR 76 | Ht 70.0 in | Wt 181.0 lb

## 2023-01-02 DIAGNOSIS — S42035A Nondisplaced fracture of lateral end of left clavicle, initial encounter for closed fracture: Secondary | ICD-10-CM

## 2023-01-02 DIAGNOSIS — S42025A Nondisplaced fracture of shaft of left clavicle, initial encounter for closed fracture: Secondary | ICD-10-CM

## 2023-01-02 NOTE — Progress Notes (Signed)
Patient: Nicholas Marquez           Date of Birth: [birth year]           MRN: [id number] Visit Date: 01/02/2023 Requested by: Center, [facility] [address] [place],  [place] [address] PCP: [facility]   Chief Complaint  Patient presents with   Clavicle Injury    Left 12/09/22   Encounter Diagnosis  Name Primary?   Nondisplaced fracture of shaft of left clavicle, initial encounter for closed fracture Yes    Plan:  #1 he can remove the sling  #2 he can start pendulum exercises  #3 follow-up in 6 weeks no x-rays needed    Chief Complaint  Patient presents with   Clavicle Injury    Left 12/09/22    52 year old male 4 weeks ago on August 2 fell at [facility] city/MRI old and injured his left clavicle.  Imaging was obtained they are and [facility] here for evaluation of the left clavicle  After some initial severe pain his pain has been under control and he is worn his sling religiously no other complaints   Body mass index is 25.97 kg/m.   Problem list, medical hx, medications and allergies reviewed   Review of Systems  Musculoskeletal:  Positive for back pain and joint pain.     Allergies  Allergen Reactions   Citalopram Other (See Comments)    Platelet counts decreased   Levofloxacin Rash   Chantix [Varenicline Tartrate] Other (See Comments)    dizziness    BP 108/70   Pulse 76   Ht 5\' 10"  (1.778 m)   Wt 181 lb (82.1 kg)   BMI 25.97 kg/m    Physical exam: Physical Exam Vitals and nursing note reviewed.  Constitutional:      Appearance: Normal appearance.  HENT:     Head: Normocephalic and atraumatic.  Eyes:     General: No scleral icterus.       Right eye: No discharge.        Left eye: No discharge.     Extraocular Movements: Extraocular movements intact.     Conjunctiva/sclera: Conjunctivae normal.     Pupils: Pupils are equal, round, and reactive to light.  Cardiovascular:      Rate and Rhythm: Normal rate.     Pulses: Normal pulses.  Skin:    General: Skin is warm and dry.     Capillary Refill: Capillary refill takes less than 2 seconds.  Neurological:     General: No focal deficit present.     Mental Status: He is alert and oriented to person, place, and time.  Psychiatric:        Mood and Affect: Mood normal.        Behavior: Behavior normal.        Thought Content: Thought content normal.        Judgment: Judgment normal.   Left Shoulder Exam   Other  Erythema: absent Scars: absent Sensation: normal Pulse: present   Comments:  I do not palpate any tenderness over the clavicle  His arm is in a sling  Hand function is normal  No neurovascular deficits     Data reviewed:   Image(s) reviewed with personal interpretation:  No outside images available  Assessment and plan:  Encounter Diagnosis  Name Primary?   Nondisplaced fracture of shaft of left clavicle, initial encounter for closed fracture Yes  Nondisplaced distal clavicle fracture no evidence of ligamentous disruption of the coracoclavicular ligaments  Nonoperative treatment remove sling start pendulums and range of motion   No orders of the defined types were placed in this encounter.   Procedures:   No injections

## 2023-02-14 DIAGNOSIS — S42035D Nondisplaced fracture of lateral end of left clavicle, subsequent encounter for fracture with routine healing: Secondary | ICD-10-CM | POA: Insufficient documentation

## 2023-02-16 ENCOUNTER — Ambulatory Visit (INDEPENDENT_AMBULATORY_CARE_PROVIDER_SITE_OTHER): Payer: No Typology Code available for payment source | Admitting: Orthopedic Surgery

## 2023-02-16 DIAGNOSIS — S42035D Nondisplaced fracture of lateral end of left clavicle, subsequent encounter for fracture with routine healing: Secondary | ICD-10-CM

## 2023-02-16 DIAGNOSIS — T8090XA Unspecified complication following infusion and therapeutic injection, initial encounter: Secondary | ICD-10-CM

## 2023-02-16 NOTE — Progress Notes (Signed)
Fracture care follow-up  Distal clavicle fracture  12/09/2022 date of injury  First office visit 01/02/2023  Chief Complaint  Patient presents with   Shoulder Pain    Follow up left shoulder pain  patient says he can walk the wall but can not do it without using the wall   he can not  lift the left arm without assists of the right hand has tried sleeping on that side but can't for  long periods of time    Patient states he was doing great until he got his flu shot in his left arm and since that time has had pain in his deltoid and his range of motion has decreased  He did exhibit abduction and flexion 80 and 120 degrees respectively  Assessment and plan nondisplaced distal clavicle fracture with persistent range of motion and strength deficit recommend HOME physical therapy  Encounter Diagnoses  Name Primary?   Closed nondisplaced fracture of acromial end of left clavicle with routine healing, subsequent encounter 12/09/22 Yes   Complication of injection, initial encounter

## 2023-03-27 ENCOUNTER — Encounter: Payer: No Typology Code available for payment source | Admitting: Orthopedic Surgery

## 2023-03-27 DIAGNOSIS — S42035D Nondisplaced fracture of lateral end of left clavicle, subsequent encounter for fracture with routine healing: Secondary | ICD-10-CM

## 2023-03-30 ENCOUNTER — Encounter: Payer: No Typology Code available for payment source | Admitting: Orthopedic Surgery
